# Patient Record
Sex: Male | Born: 1970 | Hispanic: No | Marital: Single | Smoking: Never smoker
Health system: Southern US, Community
[De-identification: ages and names within clinical notes are randomized; demographics above are authoritative.]

## PROBLEM LIST (undated history)

## (undated) DIAGNOSIS — K219 Gastro-esophageal reflux disease without esophagitis: Secondary | ICD-10-CM

## (undated) DIAGNOSIS — E119 Type 2 diabetes mellitus without complications: Secondary | ICD-10-CM

## (undated) DIAGNOSIS — K648 Other hemorrhoids: Principal | ICD-10-CM

## (undated) DIAGNOSIS — E785 Hyperlipidemia, unspecified: Secondary | ICD-10-CM

## (undated) DIAGNOSIS — E079 Disorder of thyroid, unspecified: Secondary | ICD-10-CM

## (undated) HISTORY — DX: Other hemorrhoids: K64.8

## (undated) HISTORY — DX: Disorder of thyroid, unspecified: E07.9

## (undated) HISTORY — DX: Hyperlipidemia, unspecified: E78.5

## (undated) HISTORY — DX: Type 2 diabetes mellitus without complications: E11.9

## (undated) HISTORY — DX: Gastro-esophageal reflux disease without esophagitis: K21.9

---

## 2001-05-30 ENCOUNTER — Inpatient Hospital Stay (HOSPITAL_COMMUNITY): Admission: EM | Admit: 2001-05-30 | Discharge: 2001-06-02 | Payer: Self-pay | Admitting: Emergency Medicine

## 2001-05-30 ENCOUNTER — Encounter: Payer: Self-pay | Admitting: Emergency Medicine

## 2001-05-30 ENCOUNTER — Encounter: Payer: Self-pay | Admitting: Family Medicine

## 2001-06-05 ENCOUNTER — Encounter: Admission: RE | Admit: 2001-06-05 | Discharge: 2001-06-05 | Payer: Self-pay | Admitting: Family Medicine

## 2001-06-18 ENCOUNTER — Encounter: Admission: RE | Admit: 2001-06-18 | Discharge: 2001-06-18 | Payer: Self-pay | Admitting: Family Medicine

## 2001-07-30 ENCOUNTER — Ambulatory Visit (HOSPITAL_COMMUNITY): Admission: RE | Admit: 2001-07-30 | Discharge: 2001-07-30 | Payer: Self-pay | Admitting: Family Medicine

## 2001-07-31 ENCOUNTER — Encounter: Payer: Self-pay | Admitting: Family Medicine

## 2004-01-16 ENCOUNTER — Encounter (HOSPITAL_COMMUNITY): Admission: RE | Admit: 2004-01-16 | Discharge: 2004-04-15 | Payer: Self-pay | Admitting: Endocrinology

## 2005-01-17 ENCOUNTER — Ambulatory Visit (HOSPITAL_COMMUNITY): Admission: RE | Admit: 2005-01-17 | Discharge: 2005-01-17 | Payer: Self-pay | Admitting: Gastroenterology

## 2011-05-15 ENCOUNTER — Emergency Department (HOSPITAL_COMMUNITY): Payer: BC Managed Care – PPO

## 2011-05-15 ENCOUNTER — Emergency Department (HOSPITAL_COMMUNITY)
Admission: EM | Admit: 2011-05-15 | Discharge: 2011-05-15 | Disposition: A | Discharge status: Home / Self Care | Payer: BC Managed Care – PPO | Attending: Emergency Medicine | Admitting: Emergency Medicine

## 2011-05-15 DIAGNOSIS — IMO0002 Reserved for concepts with insufficient information to code with codable children: Secondary | ICD-10-CM | POA: Insufficient documentation

## 2011-05-15 DIAGNOSIS — Z79899 Other long term (current) drug therapy: Secondary | ICD-10-CM | POA: Insufficient documentation

## 2011-05-15 DIAGNOSIS — E789 Disorder of lipoprotein metabolism, unspecified: Secondary | ICD-10-CM | POA: Insufficient documentation

## 2011-05-15 DIAGNOSIS — R070 Pain in throat: Secondary | ICD-10-CM | POA: Insufficient documentation

## 2011-05-15 DIAGNOSIS — T17208A Unspecified foreign body in pharynx causing other injury, initial encounter: Secondary | ICD-10-CM | POA: Insufficient documentation

## 2011-10-06 ENCOUNTER — Ambulatory Visit (INDEPENDENT_AMBULATORY_CARE_PROVIDER_SITE_OTHER): Payer: BC Managed Care – PPO

## 2011-10-06 DIAGNOSIS — F809 Developmental disorder of speech and language, unspecified: Secondary | ICD-10-CM

## 2011-10-06 DIAGNOSIS — E782 Mixed hyperlipidemia: Secondary | ICD-10-CM

## 2011-10-06 DIAGNOSIS — E039 Hypothyroidism, unspecified: Secondary | ICD-10-CM

## 2011-12-25 ENCOUNTER — Ambulatory Visit: Payer: BC Managed Care – PPO

## 2011-12-25 ENCOUNTER — Other Ambulatory Visit: Payer: Self-pay | Admitting: Internal Medicine

## 2011-12-25 ENCOUNTER — Ambulatory Visit (INDEPENDENT_AMBULATORY_CARE_PROVIDER_SITE_OTHER): Payer: BC Managed Care – PPO | Admitting: Family Medicine

## 2011-12-25 VITALS — BP 98/62 | HR 60 | Temp 98.3°F | Resp 16 | Ht 63.5 in | Wt 133.2 lb

## 2011-12-25 DIAGNOSIS — R739 Hyperglycemia, unspecified: Secondary | ICD-10-CM

## 2011-12-25 DIAGNOSIS — M79609 Pain in unspecified limb: Secondary | ICD-10-CM

## 2011-12-25 DIAGNOSIS — M79672 Pain in left foot: Secondary | ICD-10-CM

## 2011-12-25 DIAGNOSIS — E785 Hyperlipidemia, unspecified: Secondary | ICD-10-CM | POA: Insufficient documentation

## 2011-12-25 DIAGNOSIS — E039 Hypothyroidism, unspecified: Secondary | ICD-10-CM | POA: Insufficient documentation

## 2011-12-25 LAB — POCT CBC
HCT, POC: 40.9 % — AB (ref 43.5–53.7)
Hemoglobin: 13.5 g/dL — AB (ref 14.1–18.1)
Lymph, poc: 3.3 (ref 0.6–3.4)
MCHC: 33 g/dL (ref 31.8–35.4)
MPV: 10.2 fL (ref 0–99.8)
POC Granulocyte: 5.5 (ref 2–6.9)
RBC: 4.66 M/uL — AB (ref 4.69–6.13)

## 2011-12-25 LAB — POCT SEDIMENTATION RATE: POCT SED RATE: 19 mm/hr (ref 0–22)

## 2011-12-25 MED ORDER — PREDNISONE 20 MG PO TABS: ORAL_TABLET | ORAL | Status: DC

## 2011-12-25 NOTE — Progress Notes (Signed)
  Subjective:    Patient ID: Samuel Meza, male    DOB: Oct 13, 1970, 41 y.o.   MRN: [id number]  Toe Pain  The incident occurred 3 to 5 days ago. The incident occurred at home. There was no injury mechanism. The pain is present in the left toes. The quality of the pain is described as shooting. The pain is moderate. Associated symptoms include an inability to bear weight. He reports no foreign bodies present.  Samuel Meza is a 41 year old [place] who is here with his son, Hi, who speaks some [place].  He returned from [place] 2 days ago and complains of pain in the front of his left foot.  Very tender, unable to bear weight on front of foot.  He had an episode of pain and swelling in his left foot while in [place] but I was unable to determine how long ago that was in relation to today's symptoms.  His son said he had never had gout.  This is the first time his foot has ever hurt him and he denies injury.  He is treated by Dr. Neva Seat for hyperlipidemia and hypthyroidism.    Review of Systems  Constitutional: Negative.   HENT: Negative.   Respiratory: Negative.   Cardiovascular: Negative.   Musculoskeletal: Positive for joint swelling and gait problem.  Neurological: Negative.   Hematological: Negative.   Psychiatric/Behavioral: Negative.   All other systems reviewed and are negative.       Objective:   Physical Exam  Vitals reviewed. Constitutional: He appears well-developed and well-nourished.  HENT:  Head: Normocephalic.  Right Ear: External ear normal.  Left Ear: External ear normal.  Eyes: Conjunctivae are normal.  Cardiovascular: Normal rate, regular rhythm and normal heart sounds.   Pulmonary/Chest: Breath sounds normal.  Musculoskeletal:       Exquisite tenderness over his left mtp joints 2-4.  He has no swelling present on exam, his great toe is not as tender as the middle of his foot.  Left knee, achilles heel without pain or abnormality on exam  Neurological: He is alert.    Skin: Skin is warm and dry. No erythema.  Psychiatric: He has a normal mood and affect. His behavior is normal.     [facility] reading (PRIMARY) by  Dr. Milus Glazier.  No fractures, erosions, or bony abnormalities seen.       Assessment & Plan:  TEndinitis and overuse likely from walking in [place].  Discussed with Dr. Milus Glazier, will treat with 40 mg of prednisone for 5 days.  Return in 3 months for recheck on lipids and elevated glucose.

## 2011-12-25 NOTE — Patient Instructions (Signed)
Vim Gn (Tendinitis) Vim gn l tnh tr?ng s?ng t?y (vim) c?a m ???c g?i l gn. Gn c c?u trc gi?ng nh? s?i dy g?n c? vo x??ng. B?nh th??ng x?y ra ?:  Vai (c? chp xoay).   Gt chn (gn gt achilles).   Khu?u tay (gn c? tam ??u).  NGUYN NHN Vim gn th??ng gy ra b?i vi?c l?m d?ng gn, c? v kh?p lin quan. Khi m xung quanh gn (mng ho?t d?ch) b? vim, n ???c g?i l vim bao gn. Vim gn th??ng pht tri?n ? nh?ng ng??i c cng vi?c ?i h?i chuy?n ??ng l?p ?i l?p l?i. TRI?U CH?NG  ?au.   Nh?y c?m ?au.   S?ng nh?.  CH?N ?ON Vim gn th??ng ???c ch?n ?on b?ng cch khm tr?c ti?p. Chuyn gia ch?m [place] y t? c?a b?n c?ng c th? yu c?u ch?p [place] ho?c cc xt nghi?m hnh ?nh khc.  ?I?U TR? Chuyn gia ch?m [place] y t? c?a b?n c th? gi?i thi?u m?t s? lo?i thu?c ho?c cc bi t?p cho vi?c ?i?u tr? c?a b?n.  H??NG D?N CH?M [place] T?I NH  Dng b?ng ?eo hay n?p cho ??n khi gi?m ?au mi?n l theo s? ch? d?n c?a chuyn gia ch?m [place] y t? c?a b?n.   Ch??m ? l?nh ln vng b? th??ng.   Cho ? bo vo ti nh?a.   ??t kh?n t?m gi?a da v ti.   Ch??m ? l?nh trong 15 ??n 20 pht, 3 ??n 4 l?n m?i ngy.   [place] s? d?ng chn tay trong khi ?au gn. Ch? th?c hi?n hng lo?t bi t?p chuy?n ??ng nh? nhng theo ch? d?n c?a chuyn gia ch?m [place] y t?. Ng?ng t?p n?u ?au t?ng ho?c gia t?ng s? kh ch?u, tr? khi ???c chuyn gia ch?m [place] y t? ch? ??nh khc.   Ch? dng cc thu?c ???c bn khng c?n ??n thu?c c?a Bc s? ho?c theo toa c?a Bc s? ?? gi?m ?au, kh ch?u, hay s?t theo nh? h??ng d?n c?a Bc s?.  HY THAM V?N V?I CHUYN GIA Y T? N?U:  ?au hay s?ng nhi?u h?n.   Xu?t hi?n cc tri?u ch?ng m?i, khng gi?i thch ???c, ??c bi?t l t tay nhi?u h?n.  ??M B?O B?N:   Hi?u r nh?ng h??ng d?n khi xu?t vi?n.   S? theo di tnh tr?ng b?nh c?a b?n.   S? ??n khm b?nh ngay l?p t?c nh? ? ???c h??ng d?n.  Document Released: 09/23/2005 Document Revised: 09/12/2011 [facility] Patient Information  2012 [place], [place].

## 2011-12-26 LAB — BASIC METABOLIC PANEL
CO2: 29 mEq/L (ref 19–32)
Chloride: 104 mEq/L (ref 96–112)
Creat: 1.11 mg/dL (ref 0.50–1.35)
Potassium: 4.4 mEq/L (ref 3.5–5.3)
Sodium: 141 mEq/L (ref 135–145)

## 2012-01-06 ENCOUNTER — Ambulatory Visit (INDEPENDENT_AMBULATORY_CARE_PROVIDER_SITE_OTHER): Payer: BC Managed Care – PPO | Admitting: Family Medicine

## 2012-01-06 DIAGNOSIS — M109 Gout, unspecified: Secondary | ICD-10-CM

## 2012-01-06 DIAGNOSIS — J329 Chronic sinusitis, unspecified: Secondary | ICD-10-CM

## 2012-01-06 DIAGNOSIS — J019 Acute sinusitis, unspecified: Secondary | ICD-10-CM

## 2012-01-06 DIAGNOSIS — M79609 Pain in unspecified limb: Secondary | ICD-10-CM

## 2012-01-06 MED ORDER — AZITHROMYCIN 250 MG PO TABS: ORAL_TABLET | ORAL | Status: AC

## 2012-01-06 MED ORDER — INDOMETHACIN 50 MG PO CAPS: 50.0000 mg | ORAL_CAPSULE | Freq: Two times a day (BID) | ORAL | Status: AC

## 2012-01-06 NOTE — Progress Notes (Signed)
[facility]:  Office Visit  Chief Complaint:  Chief Complaint  Patient presents with  . Foot Pain    swollen for several weeks    HPI: Samuel Meza is a 41 y.o. male who complains of  2 month h/o left foot pain at dorsum of 2d-3rd MTP. Xrays done on 3/20 were negative for fx/dislocations. Was given Prednisone and felt  better but has had flare up again. This is the 4th flare up. His uric acid was 7.5 at that time. He also c/o sinus pressure, nonproductive cough and ear pressure for over 5 days. Denies fevers, chills, N/V/abd pain.   No past medical history on file. No past surgical history on file. History   Social History  . Marital Status: Single    Spouse Name: N/A    Number of Children: N/A  . Years of Education: N/A   Social History Main Topics  . Smoking status: Never Smoker   . Smokeless tobacco: None  . Alcohol Use: Yes     ocas  . Drug Use: No  . Sexually Active: None   Other Topics Concern  . None   Social History Narrative  . None   No family history on file. No Known Allergies Prior to Admission medications   Medication Sig Start Date End Date Taking? Authorizing Provider  levothyroxine (SYNTHROID, LEVOTHROID) 88 MCG tablet Take 88 mcg by mouth daily.   Yes Historical Provider, MD  simvastatin (ZOCOR) 20 MG tablet Take 20 mg by mouth every evening.   Yes Historical Provider, MD  predniSONE (DELTASONE) 20 MG tablet Take two daily for 5 days 12/25/11   Rickard Patience, PA-C     ROS: The patient denies fevers, chills, night sweats, unintentional weight loss, chest pain, palpitations, wheezing, dyspnea on exertion, nausea, vomiting, abdominal pain, dysuria, hematuria, melena, numbness, weakness, or tingling.   All other systems have been reviewed and were otherwise negative with the exception of those mentioned in the HPI and as above.    PHYSICAL EXAM: Filed Vitals:   01/06/12 0911  BP: 111/72  Pulse: 66  Temp: 98.1 F (36.7 C)  Resp:  16   Filed Vitals:   01/06/12 0911  Height: 5\' 4"  (1.626 m)  Weight: 128 lb (58.06 kg)   Body mass index is 21.97 kg/(m^2).  General: Alert, no acute distress HEENT:  Normocephalic, atraumatic, oropharynx patent. Tm nl, + sinus pressure, red throat, no exudates Cardiovascular:  Regular rate and rhythm, no rubs murmurs or gallops.  No Carotid bruits, radial pulse intact. No pedal edema.  Respiratory: Clear to auscultation bilaterally.  No wheezes, rales, or rhonchi.  No cyanosis, no use of accessory musculature GI: No organomegaly, abdomen is soft and non-tender, positive bowel sounds.  No masses. Skin: No rashes. Neurologic: Facial musculature symmetric. Psychiatric: Patient is appropriate throughout our interaction. Lymphatic: No cervical lymphadenopathy Musculoskeletal: Gait intact. Left Foot: AROM/PROM intact, sensation intact. + mild swelling and pain at dorsum of foor at 2nd and 3rd MTP   LABS: Results for orders placed in visit on 12/25/11  POCT CBC      Component Value Range   WBC 9.4  4.6 - 10.2 (K/uL)   Lymph, poc 3.3  0.6 - 3.4    POC LYMPH PERCENT 34.9  10 - 50 (%L)   MID (cbc) 0.6  0 - 0.9    POC MID % 6.9  0 - 12 (%M)   POC Granulocyte 5.5  2 - 6.9  Granulocyte percent 58.2  37 - 80 (%G)   RBC 4.66 (*) 4.69 - 6.13 (M/uL)   Hemoglobin 13.5 (*) 14.1 - 18.1 (g/dL)   HCT, POC 09.8 (*) 11.9 - 53.7 (%)   MCV 87.7  80 - 97 (fL)   MCH, POC 29.0  27 - 31.2 (pg)   MCHC 33.0  31.8 - 35.4 (g/dL)   RDW, POC 14.7     Platelet Count, POC 268  142 - 424 (K/uL)   MPV 10.2  0 - 99.8 (fL)  POCT SEDIMENTATION RATE      Component Value Range   POCT SED RATE 19  0 - 22 (mm/hr)  BASIC METABOLIC PANEL      Component Value Range   Sodium 141  135 - 145 (mEq/L)   Potassium 4.4  3.5 - 5.3 (mEq/L)   Chloride 104  96 - 112 (mEq/L)   CO2 29  19 - 32 (mEq/L)   Glucose, Bld 77  70 - 99 (mg/dL)   BUN 17  6 - 23 (mg/dL)   Creat 8.29  5.62 - 1.30 (mg/dL)   Calcium 9.3  8.4 - 86.5  (mg/dL)     EKG/XRAY:   Primary read interpreted by Dr. Conley Rolls at [facility].   ASSESSMENT/PLAN: Encounter Diagnoses  Name Primary?  . Gout Yes  . Sinusitis     Foot pain most likely related to gout. He has had no trauma. Prior xrays did not show any fractures. However, if pain continues or worsens then consider repeat xray-declines at this time. ? Stress fx vs unlikely [employer] 1. Indomethacin ( Consider maintenance allopurinol if gout flare up continues.) 2. Z-pack,  C/w OTC meds for cough    Devynne Sturdivant PHUONG, DO 01/06/2012 9:42 AM

## 2012-01-30 ENCOUNTER — Ambulatory Visit (INDEPENDENT_AMBULATORY_CARE_PROVIDER_SITE_OTHER): Payer: BC Managed Care – PPO | Admitting: Emergency Medicine

## 2012-01-30 ENCOUNTER — Ambulatory Visit: Payer: BC Managed Care – PPO

## 2012-01-30 VITALS — BP 103/68 | HR 63 | Temp 97.8°F | Resp 16 | Ht 64.0 in | Wt 127.4 lb

## 2012-01-30 DIAGNOSIS — M79672 Pain in left foot: Secondary | ICD-10-CM

## 2012-01-30 DIAGNOSIS — M79609 Pain in unspecified limb: Secondary | ICD-10-CM

## 2012-01-30 LAB — POCT CBC
Granulocyte percent: 68.6 %G (ref 37–80)
HCT, POC: 40 % — AB (ref 43.5–53.7)
Hemoglobin: 12.6 g/dL — AB (ref 14.1–18.1)
MPV: 9.9 fL (ref 0–99.8)
POC Granulocyte: 6.2 (ref 2–6.9)
POC LYMPH PERCENT: 25.7 %L (ref 10–50)
RDW, POC: 13.5 %

## 2012-01-30 MED ORDER — PREDNISONE 10 MG PO TABS: ORAL_TABLET | ORAL | Status: DC

## 2012-01-30 NOTE — Progress Notes (Signed)
  Subjective:    Patient ID: Samuel Meza, male    DOB: 07-21-1971, 41 y.o.   MRN: [id number]  HPI patient was seen here one month ago with pain and swelling over the distal left foot he has had persistent pain after being seen here. X-rays done at that time did not reveal any abnormalities of the bones. In today for recheck because he has persistent pain    Review of Systems     Objective:   Physical Exam physical exam reveals tenderness and swelling with ecchymoses present over the distal second and third metatarsals. There is pain with movement of the toes in this area. No open areas of skin  [facility] reading (PRIMARY) by  Dr.Bosten Newstrom on review of these films I do not see a definite fracture. .      Assessment & Plan:  Clinically the patient is a fracture of the distal second or third metatarsal. I will wait for the reading from the radiologist.

## 2012-01-30 NOTE — Patient Instructions (Signed)
B?nh Gt (Gout)  Gt l m?t tnh tr?ng vim nhi?m (vim kh?p) gy ra b?i s? tch t? c?a tinh th? axit uric trong kh?p. Axit uric l m?t ch?t ha h?c th??ng hi?n di?n trong mu. Trong m?t s? tr??ng h?p, axit uric c th? hnh thnh cc tinh th? trong kh?p x??ng c?a b?n. ?i?u ny gy ra t?y ??, ?au nh?c v s?ng kh?p (vim). Cc ??t ?au l?p l?i l ph? bi?n. Theo th?i gian, cc tinh th? axit uric c th? hnh thnh v?i quy m l?n (tophi) g?n kh?p, gy bi?n d?ng. Gt c th? ?i?u tr? v th??ng ng?n ng?a ???c. NGUYN NHN C?n b?nh ny b?t ??u v?i n?ng ?? axit uric trong mu t?ng. Axit uric ???c t?o ra b?i c? th? khi n ph v? m?t ch?t t? nhin tm th?y ???c g?i l purin. ?i?u ny c?ng x?y ra khi b?n ?n m?t s? lo?i th?c ph?m nh? th?t v c. Nguyn nhn c?a n?ng ?? axit uric cao bao g?m:  Truy?n t? cha m? sang con (di truy?n).   B?nh gy ra gia t?ng s?n xu?t axit uric (bo ph, b?nh v?y n?n, m?t s? b?nh ung th?).   S? d?ng r??u qu m?c.   Ch? ?? ?n, ??c bi?t l ch? ?? ?n nhi?u th?t v h?i s?n.   Thu?c, bao g?m c? m?t s? lo?i thu?c ch?ng ung th? (ha tr?), thu?c l?i ti?u v atpirin.   B?nh th?n mn tnh. Th?n khng cn c th? lo?i b? t?t axit uric.   V?n ?? v? chuy?n ha.  Cc tnh tr?ng g?n li?n v?i b?nh gt bao g?m:  Bo ph.   p huy?t cao.   Cholesterol cao.   Ti?u ???ng.  Khng ph?i t?t c? m?i ng??i v?i n?ng ?? axit uric cao ??u b? b?nh gt. Khng th? gi?i thch v sao m?t s? ng??i b? gt cn nh?ng ng??i khc l?i khng b?. Ph?u thu?t, ch?n th??ng kh?p v ?n qu nhi?u m?t s? lo?i th?c ?n nh?t ??nh l m?t s? trong nh?ng y?u t? c th? d?n ??n b?nh gt.  TRI?U CH?NG  C?n b?nh gt xu?t hi?n nhanh chng. B?nh gy ?au ??n d? d?i v?i t?y ??, s?ng v ?m trong kh?p.   C th? b? s?t.   Thng th??ng, ch? l m?t kh?p b? ?au. M?t s? kh?p nh?t ??nh th??ng b? ?au:   ?? ngn chn ci.   ??u g?i.   M?t c chn.   C? tay.   Ngn tay.  N?u khng ?i?u tr?, c?n ?au th??ng bi?n m?t trong m?t vi ngy  ??n vi tu?n. Gi?a cc c?n ?au, b?n th??ng s? khng c cc tri?u ch?ng, cc tri?u ch?ng th??ng khc v?i cc hnh th?c vim kh?p khc. CH?N ?ON Chuyn gia ch?m sc y t? s? nghi ng? b?nh gt d?a trn cc tri?u ch?ng v vi?c khm. Vi?c ht d?ch t? kh?p (ch?c kh?p) ???c th?c hi?n ?? ki?m tra cc tinh th? axit uric. Chuyn gia ch?m sc y t? s? cung c?p cho b?n m?t lo?i thu?c lm t khu v?c (gy t c?c b?) v s? d?ng m?t kim tim ?? ht d?ch kh?p ?? xt nghi?m. Gt ???c xc nh?n khi cc tinh th? axit uric ???c tm th?y trong d?ch kh?p, b?ng cch s? d?ng m?t knh hi?n vi ??c bi?t. ?i khi, cc xt nghi?m mu, n??c ti?u, X quang c?ng ???c s? d?ng. ?I?U TR? C 2 giai ?o?n ?? ?i?u tr? b?nh gt: ?i?u   tr? c?n ?au kh?i pht ??t ng?t (c?p tnh) v ng?n ch?n cc c?n ?au (d? phng). ?i?u Tr? C?n ?au C?p Tnh  Thu?c ???c s? d?ng. Chng bao g?m cc lo?i thu?c ch?ng vim ho?c thu?c steroid.   ?i khi c?n tim thu?c steroid vo kh?p b? ?au.   Kh?p ?au ???c ngh? ng?i. S? v?n ??ng c th? lm tr?m tr?ng thm b?nh vim kh?p.   B?n c th? s? d?ng ph??ng php ?i?u tr? ?m ho?c l?nh trn kh?p b? ?au, ty thu?c ph??ng php no ph h?p nh?t v?i b?n.   Th?o lu?n v? vi?c s? d?ng c ph, vitamin C, ho?c anh ?o v?i chuyn gia ch?m sc y t?. ?y c th? l cc l?a ch?n ?i?u tr? h?u ch.  ?i?u Tr? ?? Ng?n Ch?n Cc C?n ?au Sau khi c?n ?au c?p tnh gi?m, chuyn gia ch?m sc y t? c th? t? v?n s? d?ng thu?c d? phng. Nh?ng lo?i thu?c ny c gip th?n lo?i b? axit uric ra kh?i c? th? c?a b?n ho?c gi?m s?n xu?t axit uric. B?n c th? c?n ti?p t?c s? d?ng cc lo?i thu?c ny trong m?t th?i gian r?t di. Giai ?o?n ?i?u tr? ban ??u b?ng thu?c d? phng c th? k?t h?p v?i s? gia t?ng cc c?n ?au gt c?p. V l do ny, trong nh?ng thng ??u ?i?u tr?, chuyn gia ch?m sc y t? c?ng c th? t? v?n cho b?n dng thu?c th??ng ???c s? d?ng ?? ?i?u tr? b?nh gt c?p tnh. ??m b?o b?n hi?u h??ng d?n c?a chuyn gia ch?m sc y t?. B?n c?ng nn th?o lu?n v?  ph??ng php ?i?u tr? b?ng ch? ?? ?n u?ng v?i chuyn gia ch?m sc y t?. M?t s? th?c ph?m nh?t ??nh nh? th?t v c c th? lm t?ng n?ng ?? axit uric. Cc lo?i th?c ph?m khc nh? s?a c th? lm gi?m n?ng ?? ny. Chuyn gia ch?m sc y t? c th? cung c?p cho b?n m?t danh sch nh?ng th?c ph?m c?n trnh. H??NG D?N CH?M SC T?I NH  KHNG u?ng aspirin ?? gi?m ?au; thu?c ny lm t?ng m?c ?? a-xt uric.   Ch? dng cc thu?c ???c bn khng c?n ??n thu?c c?a Bc s? ho?c theo toa c?a Bc s? ?? gi?m ?au, kh ch?u, hay s?t theo nh? h??ng d?n c?a Bc s?.   ?? kh?p ngh? ng?i cng nhi?u cng t?t. Khi ? trn gi??ng, gi? cho kh?n tr?i gi??ng v ch?n trnh xa nh?ng ch? b? ?au.   Gi? cho kh?p b? ?au ? trn cao (gi? cao).   Dng n?ng n?u ch? kh?p b? ?au l ? chn.   U?ng ?? n??c v dung d?ch ?? n??c ti?u trong ho?c c mu vng nh?t . Lm nh? v?y gip c? th? c?a b?n lo?i b? ???c a-xt uric. KHNG u?ng cc ?? u?ng c ch?t c?n v nh?ng ?? u?ng ny lm ch?m qu trnh bi ti?t a-xt uric.   Th?c hi?n theo h??ng d?n v? ch? ?? ?n u?ng c?a chuyn gia ch?m sc y t?. Hy ch  c?n th?n ??n l??ng prtein b?n ?n. Ch? ?? ?n u?ng hng ngy c?a b?n nn t?p trung vo tri cy, rau, ng? c?c nguyn h?t v nh?ng s?n ph?m s?a khng c ch?t bo ho?c t ch?t bo.   Duy tr tr?ng l??ng c? th? kh?e m?nh.  H?Y THAM V?N V?I CHUYN GIA Y T? N?U:  Nhi?t ?? ?o ? mi?ng trn 102 F (38.9   C).   B?n b? tiu ch?y ho?c nn m?a.   B?n khng th?y ?? trong vng 24 ti?ng ??ng h? ho?c ngy cng n?ng h?n.  H?Y NGAY L?P T?C THAM V?N V?I CHUYN GIA Y T? N?U:  Kh?p c?a b?n b?ng d?ng tr? nn m?m h?n nhi?u v ?i km v?i:   Nh?ng c?n rng mnh v l?nh.   Nhi?t ?? ?o ? mi?ng trn 102 F (38.9 C), khng gi?m sau khi dng thu?c.  ??M B?O B?N:  Hi?u r nh?ng h??ng d?n khi xu?t vi?n.   S? theo di tnh tr?ng b?nh c?a b?n.   S? ??n khm b?nh ngay l?p t?c nh? ? ???c h??ng d?n.  Document Released: 07/03/2005 Document Revised: 09/12/2011 [employer]  Patient Information 2012 [employer]. 

## 2012-01-31 LAB — URIC ACID: Uric Acid, Serum: 9.3 mg/dL — ABNORMAL HIGH (ref 4.0–7.8)

## 2012-02-04 ENCOUNTER — Encounter: Payer: Self-pay | Admitting: Family Medicine

## 2012-02-04 ENCOUNTER — Ambulatory Visit (INDEPENDENT_AMBULATORY_CARE_PROVIDER_SITE_OTHER): Payer: BC Managed Care – PPO | Admitting: Family Medicine

## 2012-02-04 DIAGNOSIS — M109 Gout, unspecified: Secondary | ICD-10-CM | POA: Insufficient documentation

## 2012-02-04 MED ORDER — ALLOPURINOL 300 MG PO TABS: 300.0000 mg | ORAL_TABLET | Freq: Every day | ORAL | Status: DC

## 2012-02-04 NOTE — Progress Notes (Signed)
  Subjective:    Patient ID: Samuel Meza, male    DOB: 12-07-1970, 41 y.o.   MRN: [id number]  HPI 41 yo male with gout here to discuss gout prophylaxis.  Multiple flares recently and uric acid last week was over 9. Treated with prednisone at last flare.  Pain is resolved now.     Review of Systems Negative except as per HPI     Objective:   Physical Exam  Constitutional: He appears well-developed.  Pulmonary/Chest: Effort normal.  Neurological: He is alert.          Assessment & Plan:  Gout.  Start allopurinol 300 daily.  Recheck uric acid level in 1-2 weeks.

## 2012-02-25 ENCOUNTER — Ambulatory Visit (INDEPENDENT_AMBULATORY_CARE_PROVIDER_SITE_OTHER): Payer: BC Managed Care – PPO | Admitting: Physician Assistant

## 2012-02-25 DIAGNOSIS — R5383 Other fatigue: Secondary | ICD-10-CM

## 2012-02-25 DIAGNOSIS — M109 Gout, unspecified: Secondary | ICD-10-CM

## 2012-02-25 DIAGNOSIS — R5381 Other malaise: Secondary | ICD-10-CM

## 2012-02-25 NOTE — Progress Notes (Signed)
Patient ID: Samuel Meza MRN: [id number], DOB: [birth year], 41 y.o. Date of Encounter: 02/25/2012, 12:07 PM  Primary Physician: No primary provider on file.  Chief Complaint: Gout follow up  HPI: 41 y.o. year old male with history below presents for follow up of gout. See previous office visits. Patient started on Allopurinol 300mg  on 02/04/12. Doing well with treatment. No acute flares. No pain. Last uric acid 9.3 on 01/30/12. He also requests to check his thyroid. History of hypothyroidism. On Synthroid 88 mcg. Has been a little tired lately. Would like to see if his Synthroid needs to be increased.     Past Medical History  Diagnosis Date  . Hyperlipidemia   . Thyroid disease   . Gout      Home Meds: Prior to Admission medications   Medication Sig Start Date End Date Taking? Authorizing Provider  allopurinol (ZYLOPRIM) 300 MG tablet Take 1 tablet (300 mg total) by mouth daily. 02/04/12 02/03/13 Yes Lamar Laundry, MD  levothyroxine (SYNTHROID, LEVOTHROID) 88 MCG tablet Take 88 mcg by mouth daily.   Yes Historical Provider, MD  simvastatin (ZOCOR) 20 MG tablet Take 20 mg by mouth every evening.   Yes Historical Provider, MD  indomethacin (INDOCIN) 50 MG capsule Take 1 capsule (50 mg total) by mouth 2 (two) times daily with a meal. 01/06/12 04/05/12 No Thao P Le, DO  predniSONE (DELTASONE) 10 MG tablet Take 5 tablets a day at one time for 5 consecutive days 01/30/12  No Collene Gobble, MD    Allergies: No Known Allergies  History   Social History  . Marital Status: Single    Spouse Name: N/A    Number of Children: N/A  . Years of Education: N/A   Occupational History  . Not on file.   Social History Main Topics  . Smoking status: Never Smoker   . Smokeless tobacco: Not on file  . Alcohol Use: Yes     ocas  . Drug Use: No  . Sexually Active: Not on file   Other Topics Concern  . Not on file   Social History Narrative  . No narrative on file     Review of  Systems: Constitutional: negative for chills, fever, night sweats, weight changes, or fatigue  Cardiovascular: negative for chest pain or palpitations Respiratory: negative for hemoptysis, wheezing, shortness of breath, or cough Abdominal: negative for abdominal pain, nausea, or vomiting Dermatological: see above Neurologic: negative for headache, dizziness, or syncope   Physical Exam: Blood pressure 94/59, pulse 56, temperature 98.4 F (36.9 C), temperature source Oral, resp. rate 16, height 5\' 4"  (1.626 m), weight 125 lb (56.7 kg)., Body mass index is 21.46 kg/(m^2). General: Well developed, well nourished, in no acute distress. Head: Normocephalic, atraumatic, eyes without discharge, sclera non-icteric, nares are without discharge. Bilateral auditory canals clear, TM's are without perforation, pearly grey and translucent with reflective cone of light bilaterally. Oral cavity moist, posterior pharynx without exudate, erythema, peritonsillar abscess, or post nasal drip.  Neck: Supple. No thyromegaly. Full ROM. No lymphadenopathy. Lungs: Clear bilaterally to auscultation without wheezes, rales, or rhonchi. Breathing is unlabored. Heart: RRR with S1 S2. No murmurs, rubs, or gallops appreciated. Msk:  Strength and tone normal for age. No TTP over left distal foot. Extremities/Skin: Warm and dry. No clubbing or cyanosis. No edema. No rashes or suspicious lesions. Neuro: Alert and oriented X 3. Moves all extremities spontaneously. Gait is normal. CNII-XII grossly in tact. Psych:  Responds to questions appropriately  with a normal affect.   Labs: Uric acid and BMP pending TSH pending  ASSESSMENT AND PLAN:  41 y.o. year old male with gout, hyperuricemia, and hypothyroidism. 1. Hyperuricemia -Titrate Allopurinol once labs are back -Await labs -Continue to titrate ULT until goal uric acid less than 6 2. Hypothyroidism -Await labs -Refill meds pending labs  Signed, Eula Listen,  PA-C 02/25/2012 12:07 PM

## 2012-02-26 ENCOUNTER — Other Ambulatory Visit: Payer: Self-pay | Admitting: Physician Assistant

## 2012-02-26 LAB — URIC ACID: Uric Acid, Serum: 4.2 mg/dL (ref 4.0–7.8)

## 2012-02-26 LAB — TSH: TSH: 0.502 u[IU]/mL (ref 0.350–4.500)

## 2012-02-26 LAB — BASIC METABOLIC PANEL
Chloride: 102 mEq/L (ref 96–112)
Creat: 0.87 mg/dL (ref 0.50–1.35)

## 2012-02-26 MED ORDER — ALLOPURINOL 300 MG PO TABS: ORAL_TABLET | ORAL | Status: DC

## 2012-04-12 ENCOUNTER — Ambulatory Visit (INDEPENDENT_AMBULATORY_CARE_PROVIDER_SITE_OTHER): Payer: BC Managed Care – PPO | Admitting: Emergency Medicine

## 2012-04-12 VITALS — BP 108/72 | HR 88 | Temp 97.8°F | Resp 16 | Ht 63.75 in | Wt 125.4 lb

## 2012-04-12 DIAGNOSIS — E785 Hyperlipidemia, unspecified: Secondary | ICD-10-CM

## 2012-04-12 DIAGNOSIS — E039 Hypothyroidism, unspecified: Secondary | ICD-10-CM

## 2012-04-12 DIAGNOSIS — M109 Gout, unspecified: Secondary | ICD-10-CM

## 2012-04-12 LAB — POCT CBC
Granulocyte percent: 74.6 %G (ref 37–80)
HCT, POC: 46.6 % (ref 43.5–53.7)
Hemoglobin: 14.3 g/dL (ref 14.1–18.1)
MPV: 10 fL (ref 0–99.8)
POC Granulocyte: 8.2 — AB (ref 2–6.9)
POC MID %: 5.4 %M (ref 0–12)
RBC: 5.14 M/uL (ref 4.69–6.13)

## 2012-04-12 LAB — LIPID PANEL
Cholesterol: 146 mg/dL (ref 0–200)
Total CHOL/HDL Ratio: 3 Ratio
Triglycerides: 147 mg/dL (ref <150)
VLDL: 29 mg/dL (ref 0–40)

## 2012-04-12 LAB — URIC ACID: Uric Acid, Serum: 4.8 mg/dL (ref 4.0–7.8)

## 2012-04-12 MED ORDER — SIMVASTATIN 20 MG PO TABS: 20.0000 mg | ORAL_TABLET | Freq: Every evening | ORAL | Status: DC

## 2012-04-12 MED ORDER — LEVOTHYROXINE SODIUM 88 MCG PO TABS: 88.0000 ug | ORAL_TABLET | Freq: Every day | ORAL | Status: DC

## 2012-04-12 MED ORDER — ALLOPURINOL 300 MG PO TABS: ORAL_TABLET | ORAL | Status: DC

## 2012-04-12 NOTE — Progress Notes (Signed)
  Subjective:    Patient ID: Samuel Meza, male    DOB: 1970/11/25, 40 y.o.   MRN: [id number]  HPI patient here requesting refills on his medications. He is currently on thyroid medications cholesterol and gout drugs. He is doing well on his current medications.   Review of Systems     Objective:   Physical Exam  HENT:  Right Ear: External ear normal.  Left Ear: External ear normal.  Eyes: Pupils are equal, round, and reactive to light.  Neck: Normal range of motion. No thyromegaly present.  Cardiovascular: Normal rate and regular rhythm.   Pulmonary/Chest: Effort normal. No respiratory distress. He has no wheezes. He has no rales.  Abdominal: Soft.   Results for orders placed in visit on 04/12/12  POCT CBC      Component Value Range   WBC 11.0 (*) 4.6 - 10.2 K/uL   Lymph, poc 2.2  0.6 - 3.4   POC LYMPH PERCENT 20.0  10 - 50 %L   MID (cbc) 0.6  0 - 0.9   POC MID % 5.4  0 - 12 %M   POC Granulocyte 8.2 (*) 2 - 6.9   Granulocyte percent 74.6  37 - 80 %G   RBC 5.14  4.69 - 6.13 M/uL   Hemoglobin 14.3  14.1 - 18.1 g/dL   HCT, POC 62.1  30.8 - 53.7 %   MCV 90.7  80 - 97 fL   MCH, POC 27.8  27 - 31.2 pg   MCHC 30.7 (*) 31.8 - 35.4 g/dL   RDW, POC 65.7     Platelet Count, POC 276  142 - 424 K/uL   MPV 10.0  0 - 99.8 fL         Assessment & Plan:     Patient doing well on current medications. No change in medications at present.

## 2012-06-25 ENCOUNTER — Ambulatory Visit: Payer: BC Managed Care – PPO | Admitting: Internal Medicine

## 2012-06-28 ENCOUNTER — Ambulatory Visit (INDEPENDENT_AMBULATORY_CARE_PROVIDER_SITE_OTHER): Payer: BC Managed Care – PPO | Admitting: Family Medicine

## 2012-06-28 VITALS — BP 128/70 | HR 72 | Temp 97.3°F | Resp 18 | Ht 63.75 in | Wt 125.0 lb

## 2012-06-28 DIAGNOSIS — J029 Acute pharyngitis, unspecified: Secondary | ICD-10-CM

## 2012-06-28 MED ORDER — PENICILLIN V POTASSIUM 500 MG PO TABS: 500.0000 mg | ORAL_TABLET | Freq: Three times a day (TID) | ORAL | Status: DC

## 2012-06-28 MED ORDER — IBUPROFEN 600 MG PO TABS: 600.0000 mg | ORAL_TABLET | Freq: Three times a day (TID) | ORAL | Status: DC | PRN

## 2012-06-28 NOTE — Progress Notes (Signed)
@[email or web address] @   Patient ID: Samuel Meza MRN: [id number], DOB: 01-01-1971, 41 y.o. Date of Encounter: 06/28/2012, 2:52 PM  Primary Physician: No primary provider on file.  Chief Complaint:  Chief Complaint  Patient presents with  . Sore Throat    2-3 days  . Fatigue    2-3 days     HPI: 41 y.o. year old male presents with day history of sore throat. Subjective fever and chills. No cough, congestion, rhinorrhea, sinus pressure, otalgia, or headache. Normal hearing. No GI complaints. Able to swallow saliva, but hurts to do so. Decreased appetite secondary to sore throat.   Past Medical History  Diagnosis Date  . Hyperlipidemia   . Thyroid disease   . Gout      Home Meds: Prior to Admission medications   Medication Sig Start Date End Date Taking? Authorizing Provider  levothyroxine (SYNTHROID, LEVOTHROID) 88 MCG tablet Take 1 tablet (88 mcg total) by mouth daily. 04/12/12  Yes Collene Gobble, MD  simvastatin (ZOCOR) 20 MG tablet Take 1 tablet (20 mg total) by mouth every evening. 04/12/12  Yes Collene Gobble, MD  allopurinol (ZYLOPRIM) 300 MG tablet As directed 04/12/12   Collene Gobble, MD  ibuprofen (ADVIL,MOTRIN) 600 MG tablet Take 1 tablet (600 mg total) by mouth every 8 (eight) hours as needed for pain. 06/28/12   Elvina Sidle, MD  penicillin v potassium (VEETID) 500 MG tablet Take 1 tablet (500 mg total) by mouth 3 (three) times daily. 06/28/12   Elvina Sidle, MD  predniSONE (DELTASONE) 10 MG tablet Take 5 tablets a day at one time for 5 consecutive days 01/30/12   Collene Gobble, MD    Allergies: No Known Allergies  History   Social History  . Marital Status: Single    Spouse Name: N/A    Number of Children: N/A  . Years of Education: N/A   Occupational History  . Not on file.   Social History Main Topics  . Smoking status: Never Smoker   . Smokeless tobacco: Not on file  . Alcohol Use: Yes     ocas  . Drug Use: No  . Sexually Active: Not on file   Other Topics  Concern  . Not on file   Social History Narrative  . No narrative on file     Review of Systems: Constitutional: negative for weight changes positive for  chills, fever, night sweats  HEENT: see above Cardiovascular: negative for chest pain or palpitations Respiratory: negative for hemoptysis, wheezing, or shortness of breath Abdominal: negative for abdominal pain, nausea, vomiting or diarrhea Dermatological: negative for rash Neurologic: negative for headache   Physical Exam Blood pressure 128/70, pulse 72, temperature 97.3 F (36.3 C), temperature source Oral, resp. rate 18, height 5' 3.75" (1.619 m), weight 125 lb (56.7 kg), SpO2 98.00%., Body mass index is 21.62 kg/(m^2). General: Well developed, well nourished, in no acute distress. Head: Normocephalic, atraumatic, eyes without discharge, sclera non-icteric, nares are patent. Bilateral auditory canals clear, TM's are without perforation, pearly grey with reflective cone of light bilaterally. No sinus TTP. Oral cavity moist, dentition normal. Posterior pharynx with post nasal drip and mild erythema. No peritonsillar abscess or tonsillar exudate. Neck: Supple. No thyromegaly. Full ROM. No lymphadenopathy. Lungs: Clear bilaterally to auscultation without wheezes, rales, or rhonchi. Breathing is unlabored. Heart: RRR with S1 S2. No murmurs, rubs, or gallops appreciated. Abdomen: Soft, non-tender, non-distended with normoactive bowel sounds. No hepatomegaly. No rebound/guarding. No obvious abdominal masses. Msk:  Strength and tone normal for age. Extremities: No clubbing or cyanosis. No edema. Neuro: Alert and oriented X 3. Moves all extremities spontaneously. CNII-XII grossly in tact. Psych:  Responds to questions appropriately with a normal affect.     ASSESSMENT AND PLAN:  41 y.o. year old male with  - -Tylenol/Motrin prn -Rest/fluids -RTC precautions -RTC 3-5 days if no improvement  Signed, Elvina Sidle,  MD 06/28/2012 2:52 PM

## 2012-06-28 NOTE — Patient Instructions (Signed)
Vim H?ng (do Vi-rt v Vi khu?n) (Pharyngitis, Viral and Bacterial) Vim h?ng l hi?n t??ng vim t?y ho?c nhi?m trng h?ng, ho?c ?au h?ng. NGUYN NHN ?a s? cc tr??ng h?p vim h?ng l do vi-rt v b? vim khi c?m l?nh. Tuy nhin, m?t s? tr??ng h?p vim h?ng l do vi khu?n (vi trng) lin c?u v cc vi khu?n khc gy ra. ?au h?ng c?ng c th? l do ch?y n??c pha sau m?i do d?n l?u xoang, d? ?ng, v ?i khi cn do h mi?ng khi ng?. ?au h?ng do nhi?m trng c th? ly t? ng??i ny sang ng??i khc qua ho, h?t h?i v dng chung ly ho?c bt ??a ?n u?ng. ?I?U TR? ?au h?ng do vi-rt th??ng ko di 3-4 ngy. B?nh do vi-rt s? ?? m khng c?n thu?c khng sinh. ?au h?ng do khu?n lin c?u v cc hi?n t??ng nhi?m trng do vi khu?n khc th??ng b?t ??u ?? kho?ng 24 ??n 48 gi? sau khi b?n b?t ??u u?ng khng sinh. H??NG D?N CH?M [place] T?I NH  N?u bc s? th?y b?n b? nhi?m trng do vi khu?n ho?c n?u xt nghi?m khu?n lin c?u c?a b?n l d??ng tnh, bc s? s? cho b?n ?i?u tr? b?ng khng sinh. B?n ph?i u?ng h?t ??t khng sinh!! N?u b?n khng u?ng h?t ??t khng sinh, b?n c th? s? ?m tr? l?i. N?u b?n b? ?au h?ng do khu?n lin c?u v khng u?ng h?t ton b? ??t thu?c, b?n c th? m?c b?nh tim ho?c th?n nguy hi?m.   U?ng th?t nhi?u n??c. Kho?ng 8-10 ly n??c m?i ngy. (V d? nh? n??c, n??c p hoa qu?, n??c qu?, Kool-aid, Gatorade, x-?a v.v...).   Ch? dng cc thu?c ???c bn khng c?n ??n thu?c c?a Bc s? ho?c theo toa c?a Bc s? ?? gi?m ?au, kh ch?u, hay s?t theo nh? h??ng d?n c?a Bc s?.   Ngh? ng?i nhi?u.   [place] h?ng b?ng n??c mu?i (1/2 tha c ph mu?i trong m?t ly n??c) t? 1-2 gi? m?t l?n khi c?n ?? th?y d? ch?u.   Ng?m k?o c?ng, ho?c vin thu?c ng?m/thu?c x?t ch?a ?au h?ng.  HY ??N KHM B?NH NGAY L?P T?C N?U:  B?n n?i h?ch to v m?m trn c?.   B?n ln m? ?ay.   B?n ho ra ??m mu xanh, nu vng ho?c c mu.   Tr? h?n 3 thng tu?i c nhi?t ?? ?o ? h?u mn l 100,5 F (38,1 C) ho?c cao h?n trong h?n 1 ngy.    HY ??N KHM B?NH NGAY L?P T?C N?U:  B?n b? c?ng c?.   B?n b? ch?y n??c m?i ho?c khng th? nu?t n??c ???c.   B?n b? nn m?a, khng th? gi? khng nn ra thu?c ho?c n??c.   B?n b? ?au d? d?i, u?ng cc lo?i thu?c ? ch? d?n khng c tc d?ng.   B?n kh th? (khng ph?i l do ng?t m?i).   B?n hay con c?a b?n khng th? h to mi?ng.   Xu?t hi?n ??, s?ng ph, hay ?au nhi?u b?t k? n?i no ? c?.   B?n b? s?t.   Tr? h?n 3 thng tu?i c nhi?t ?? ?o ? h?u mn l 102 F (38,9 C) ho?c cao h?n.   Tr? 3 thng tu?i ho?c nh? h?n c nhi?t ?? ?o ? h?u mn l 100,4 F (38 C) ho?c cao h?n.  HY CH?C CH?N R?NG B?N:  Hi?u r nh?ng h??ng d?n khi xu?t vi?n.  S? theo di tnh tr?ng b?nh c?a b?n.   S? ??n khm b?nh ngay l?p t?c nh? ? ???c h??ng d?n.  Document Released: 09/23/2005 Document Revised: 09/12/2011 [facility] Patient Information 2012 [place], [place].

## 2012-06-28 NOTE — Addendum Note (Signed)
Addended by: Elvina Sidle on: 06/28/2012 02:55 PM   Modules accepted: Orders

## 2012-06-30 LAB — CULTURE, GROUP A STREP: Organism ID, Bacteria: NORMAL

## 2012-08-03 ENCOUNTER — Encounter: Payer: Self-pay | Admitting: Internal Medicine

## 2012-08-03 ENCOUNTER — Other Ambulatory Visit (INDEPENDENT_AMBULATORY_CARE_PROVIDER_SITE_OTHER): Payer: BC Managed Care – PPO

## 2012-08-03 ENCOUNTER — Ambulatory Visit (INDEPENDENT_AMBULATORY_CARE_PROVIDER_SITE_OTHER): Payer: BC Managed Care – PPO | Admitting: Internal Medicine

## 2012-08-03 VITALS — BP 112/68 | HR 68 | Temp 97.3°F | Resp 16 | Ht 63.75 in | Wt 125.0 lb

## 2012-08-03 DIAGNOSIS — E039 Hypothyroidism, unspecified: Secondary | ICD-10-CM

## 2012-08-03 DIAGNOSIS — M109 Gout, unspecified: Secondary | ICD-10-CM

## 2012-08-03 DIAGNOSIS — E785 Hyperlipidemia, unspecified: Secondary | ICD-10-CM

## 2012-08-03 DIAGNOSIS — Z136 Encounter for screening for cardiovascular disorders: Secondary | ICD-10-CM | POA: Insufficient documentation

## 2012-08-03 DIAGNOSIS — Z0000 Encounter for general adult medical examination without abnormal findings: Secondary | ICD-10-CM

## 2012-08-03 DIAGNOSIS — R7309 Other abnormal glucose: Secondary | ICD-10-CM

## 2012-08-03 DIAGNOSIS — M791 Myalgia, unspecified site: Secondary | ICD-10-CM | POA: Insufficient documentation

## 2012-08-03 DIAGNOSIS — IMO0001 Reserved for inherently not codable concepts without codable children: Secondary | ICD-10-CM

## 2012-08-03 DIAGNOSIS — R739 Hyperglycemia, unspecified: Secondary | ICD-10-CM | POA: Insufficient documentation

## 2012-08-03 LAB — COMPREHENSIVE METABOLIC PANEL
ALT: 34 U/L (ref 0–53)
Alkaline Phosphatase: 36 U/L — ABNORMAL LOW (ref 39–117)
Sodium: 139 mEq/L (ref 135–145)
Total Bilirubin: 0.9 mg/dL (ref 0.3–1.2)
Total Protein: 7.5 g/dL (ref 6.0–8.3)

## 2012-08-03 LAB — LIPID PANEL
LDL Cholesterol: 76 mg/dL (ref 0–99)
VLDL: 30.8 mg/dL (ref 0.0–40.0)

## 2012-08-03 LAB — CBC WITH DIFFERENTIAL/PLATELET
Eosinophils Relative: 1.6 % (ref 0.0–5.0)
HCT: 45.1 % (ref 39.0–52.0)
Lymphs Abs: 1.9 10*3/uL (ref 0.7–4.0)
MCV: 87.5 fl (ref 78.0–100.0)
Monocytes Absolute: 0.5 10*3/uL (ref 0.1–1.0)
Platelets: 287 10*3/uL (ref 150.0–400.0)
RDW: 13.6 % (ref 11.5–14.6)
WBC: 7 10*3/uL (ref 4.5–10.5)

## 2012-08-03 LAB — URINALYSIS, ROUTINE W REFLEX MICROSCOPIC
Bilirubin Urine: NEGATIVE
Hgb urine dipstick: NEGATIVE
Nitrite: NEGATIVE
Total Protein, Urine: NEGATIVE

## 2012-08-03 LAB — SEDIMENTATION RATE: Sed Rate: 17 mm/hr (ref 0–22)

## 2012-08-03 LAB — T4: T4, Total: 10.2 ug/dL (ref 5.0–12.5)

## 2012-08-03 LAB — CK: Total CK: 160 U/L (ref 7–232)

## 2012-08-03 LAB — T3, FREE: T3, Free: 3.1 pg/mL (ref 2.3–4.2)

## 2012-08-03 LAB — PSA: PSA: 1.37 ng/mL (ref 0.10–4.00)

## 2012-08-03 NOTE — Assessment & Plan Note (Signed)
He is having myalgias and arthralgias so I have asked him to stop simvastatin, I will check his CPK and ESR today to see if he has a myopathy or myositis

## 2012-08-03 NOTE — Patient Instructions (Signed)
Health Maintenance, Males A healthy lifestyle and preventative care can promote health and wellness.  Maintain regular health, dental, and eye exams.  Eat a healthy diet. Foods like vegetables, fruits, whole grains, low-fat dairy products, and lean protein foods contain the nutrients you need without too many calories. Decrease your intake of foods high in solid fats, added sugars, and salt. Get information about a proper diet from your caregiver, if necessary.  Regular physical exercise is one of the most important things you can do for your health. Most adults should get at least 150 minutes of moderate-intensity exercise (any activity that increases your heart rate and causes you to sweat) each week. In addition, most adults need muscle-strengthening exercises on 2 or more days a week.   Maintain a healthy weight. The body mass index (BMI) is a screening tool to identify possible weight problems. It provides an estimate of body fat based on height and weight. Your caregiver can help determine your BMI, and can help you achieve or maintain a healthy weight. For adults 20 years and older:  A BMI below 18.5 is considered underweight.  A BMI of 18.5 to 24.9 is normal.  A BMI of 25 to 29.9 is considered overweight.  A BMI of 30 and above is considered obese.  Maintain normal blood lipids and cholesterol by exercising and minimizing your intake of saturated fat. Eat a balanced diet with plenty of fruits and vegetables. Blood tests for lipids and cholesterol should begin at age 20 and be repeated every 5 years. If your lipid or cholesterol levels are high, you are over 50, or you are a high risk for heart disease, you may need your cholesterol levels checked more frequently.Ongoing high lipid and cholesterol levels should be treated with medicines, if diet and exercise are not effective.  If you smoke, find out from your caregiver how to quit. If you do not use tobacco, do not start.  If you  choose to drink alcohol, do not exceed 2 drinks per day. One drink is considered to be 12 ounces (355 mL) of beer, 5 ounces (148 mL) of wine, or 1.5 ounces (44 mL) of liquor.  Avoid use of street drugs. Do not share needles with anyone. Ask for help if you need support or instructions about stopping the use of drugs.  High blood pressure causes heart disease and increases the risk of stroke. Blood pressure should be checked at least every 1 to 2 years. Ongoing high blood pressure should be treated with medicines if weight loss and exercise are not effective.  If you are 45 to 40 years old, ask your caregiver if you should take aspirin to prevent heart disease.  Diabetes screening involves taking a blood sample to check your fasting blood sugar level. This should be done once every 3 years, after age 45, if you are within normal weight and without risk factors for diabetes. Testing should be considered at a younger age or be carried out more frequently if you are overweight and have at least 1 risk factor for diabetes.  Colorectal cancer can be detected and often prevented. Most routine colorectal cancer screening begins at the age of 50 and continues through age 75. However, your caregiver may recommend screening at an earlier age if you have risk factors for colon cancer. On a yearly basis, your caregiver may provide home test kits to check for hidden blood in the stool. Use of a small camera at the end of a tube,   to directly examine the colon (sigmoidoscopy or colonoscopy), can detect the earliest forms of colorectal cancer. Talk to your caregiver about this at age 50, when routine screening begins. Direct examination of the colon should be repeated every 5 to 10 years through age 75, unless early forms of pre-cancerous polyps or small growths are found.  Hepatitis C blood testing is recommended for all people born from 1945 through 1965 and any individual with known risks for hepatitis C.  Healthy  men should no longer receive prostate-specific antigen (PSA) blood tests as part of routine cancer screening. Consult with your caregiver about prostate cancer screening.  Testicular cancer screening is not recommended for adolescents or adult males who have no symptoms. Screening includes self-exam, caregiver exam, and other screening tests. Consult with your caregiver about any symptoms you have or any concerns you have about testicular cancer.  Practice safe sex. Use condoms and avoid high-risk sexual practices to reduce the spread of sexually transmitted infections (STIs).  Use sunscreen with a sun protection factor (SPF) of 30 or greater. Apply sunscreen liberally and repeatedly throughout the day. You should seek shade when your shadow is shorter than you. Protect yourself by wearing long sleeves, pants, a wide-brimmed hat, and sunglasses year round, whenever you are outdoors.  Notify your caregiver of new moles or changes in moles, especially if there is a change in shape or color. Also notify your caregiver if a mole is larger than the size of a pencil eraser.  A one-time screening for abdominal aortic aneurysm (AAA) and surgical repair of large AAAs by sound wave imaging (ultrasonography) is recommended for ages 65 to 75 years who are current or former smokers.  Stay current with your immunizations. Document Released: 03/21/2008 Document Revised: 12/16/2011 Document Reviewed: 02/18/2011 [employer] Patient Information 2013 [employer].  

## 2012-08-03 NOTE — Progress Notes (Signed)
Subjective:    Patient ID: Samuel Meza, male    DOB: 01-20-1971, 41 y.o.   MRN: [id number]  Thyroid Problem Presents for follow-up visit. Symptoms include anxiety, constipation, fatigue and weight loss. Patient reports no cold intolerance, depressed mood, diaphoresis, diarrhea, dry skin, hair loss, heat intolerance, hoarse voice, leg swelling, menstrual problem, nail problem, palpitations, tremors, visual change or weight gain. The symptoms have been stable. His past medical history is significant for hyperlipidemia. There is no history of diabetes.  Hyperlipidemia This is a chronic problem. The current episode started more than 1 year ago. The problem is controlled. Recent lipid tests were reviewed and are variable. Exacerbating diseases include hypothyroidism. He has no history of chronic renal disease, diabetes, liver disease, obesity or nephrotic syndrome. There are no known factors aggravating his hyperlipidemia. Associated symptoms include myalgias. Pertinent negatives include no chest pain, focal sensory loss, focal weakness, leg pain or shortness of breath. Current antihyperlipidemic treatment includes statins. The current treatment provides significant improvement of lipids. There are no compliance problems.       Review of Systems  Constitutional: Positive for weight loss and fatigue. Negative for fever, chills, weight gain, diaphoresis, activity change, appetite change and unexpected weight change.  HENT: Negative.  Negative for hoarse voice.   Eyes: Negative.   Respiratory: Negative for cough, choking, chest tightness, shortness of breath, wheezing and stridor.   Cardiovascular: Negative for chest pain, palpitations and leg swelling.  Gastrointestinal: Positive for constipation. Negative for nausea, vomiting, abdominal pain, diarrhea, blood in stool, abdominal distention, anal bleeding and rectal pain.  Genitourinary: Negative.  Negative for menstrual problem.  Musculoskeletal: Positive  for myalgias and arthralgias. Negative for back pain, joint swelling and gait problem.  Skin: Negative for color change, pallor, rash and wound.  Neurological: Negative for dizziness, tremors, focal weakness, seizures, syncope, facial asymmetry, speech difficulty, weakness, light-headedness, numbness and headaches.  Hematological: Negative for cold intolerance, heat intolerance and adenopathy. Does not bruise/bleed easily.  Psychiatric/Behavioral: Negative.        Objective:   Physical Exam  Vitals reviewed. Constitutional: He is oriented to person, place, and time. He appears well-developed and well-nourished. No distress.  HENT:  Head: Normocephalic and atraumatic.  Mouth/Throat: Oropharynx is clear and moist. No oropharyngeal exudate.  Eyes: Conjunctivae normal are normal. Right eye exhibits no discharge. Left eye exhibits no discharge. No scleral icterus.  Neck: Normal range of motion. Neck supple. No JVD present. No tracheal deviation present. No thyromegaly present.  Cardiovascular: Normal rate, regular rhythm, normal heart sounds and intact distal pulses.  Exam reveals no gallop and no friction rub.   No murmur heard. Pulmonary/Chest: Effort normal and breath sounds normal. No stridor. No respiratory distress. He has no wheezes. He has no rales. He exhibits no tenderness.  Abdominal: Soft. Bowel sounds are normal. He exhibits no distension and no mass. There is no tenderness. There is no rebound and no guarding. Hernia confirmed negative in the right inguinal area and confirmed negative in the left inguinal area.  Genitourinary: Rectum normal, prostate normal and penis normal. Rectal exam shows no external hemorrhoid, no internal hemorrhoid, no fissure, no mass, no tenderness and anal tone normal. Guaiac negative stool. Prostate is not enlarged and not tender. Right testis shows no mass, no swelling and no tenderness. Right testis is descended. Left testis shows no mass, no swelling and  no tenderness. Left testis is descended. Uncircumcised. No phimosis, paraphimosis, hypospadias, penile erythema or penile tenderness. No discharge found.  Musculoskeletal: Normal range of motion. He exhibits no edema and no tenderness.  Lymphadenopathy:    He has no cervical adenopathy.       Right: No inguinal adenopathy present.       Left: No inguinal adenopathy present.  Neurological: He is alert and oriented to person, place, and time. He has normal reflexes. He displays normal reflexes. No cranial nerve deficit. He exhibits normal muscle tone. Coordination normal.  Skin: Skin is warm and dry. No rash noted. He is not diaphoretic. No erythema. No pallor.  Psychiatric: He has a normal mood and affect. His behavior is normal. Judgment and thought content normal.      Lab Results  Component Value Date   WBC 11.0* 04/12/2012   HGB 14.3 04/12/2012   HCT 46.6 04/12/2012   GLUCOSE 81 02/25/2012   CHOL 146 04/12/2012   TRIG 147 04/12/2012   HDL 48 04/12/2012   LDLCALC 69 04/12/2012   NA 140 02/25/2012   K 4.6 02/25/2012   CL 102 02/25/2012   CREATININE 0.87 02/25/2012   BUN 13 02/25/2012   CO2 30 02/25/2012   TSH 0.151* 04/12/2012      Assessment & Plan:

## 2012-08-03 NOTE — Assessment & Plan Note (Signed)
Exam done, vaccines were reviewed, labs ordered, pt ed material was given 

## 2012-08-03 NOTE — Assessment & Plan Note (Signed)
Stop the statin, check his CPK level today

## 2012-08-03 NOTE — Assessment & Plan Note (Signed)
I will check his renal function and his uric acid level today 

## 2012-08-03 NOTE — Assessment & Plan Note (Signed)
I will check his TFT's to see if his dose is accurate

## 2012-08-03 NOTE — Assessment & Plan Note (Signed)
A1C today to see if he has developed DM II

## 2012-08-19 ENCOUNTER — Other Ambulatory Visit: Payer: Self-pay

## 2012-08-19 MED ORDER — LEVOTHYROXINE SODIUM 75 MCG PO TABS: 75.0000 ug | ORAL_TABLET | Freq: Every day | ORAL | Status: DC

## 2012-08-20 ENCOUNTER — Other Ambulatory Visit: Payer: Self-pay

## 2012-10-07 HISTORY — PX: HEMORRHOID BANDING: SHX5850

## 2012-10-07 HISTORY — PX: COLONOSCOPY: SHX174

## 2012-12-15 ENCOUNTER — Telehealth: Payer: Self-pay | Admitting: *Deleted

## 2012-12-15 NOTE — Telephone Encounter (Signed)
Left msg on triage stating brother use to see Dr. Yetta Barre & he want to start seeing Dr. Jonny Ruiz. Called sister back no answer LMOM for her to contact our schedulers to see will md take him on as a new pt,,,/lmb

## 2012-12-17 ENCOUNTER — Encounter: Payer: Self-pay | Admitting: Internal Medicine

## 2012-12-17 ENCOUNTER — Telehealth: Payer: Self-pay | Admitting: Internal Medicine

## 2012-12-17 ENCOUNTER — Ambulatory Visit (INDEPENDENT_AMBULATORY_CARE_PROVIDER_SITE_OTHER): Payer: BC Managed Care – PPO | Admitting: Internal Medicine

## 2012-12-17 ENCOUNTER — Ambulatory Visit (INDEPENDENT_AMBULATORY_CARE_PROVIDER_SITE_OTHER)
Admission: RE | Admit: 2012-12-17 | Discharge: 2012-12-17 | Disposition: A | Discharge status: Home / Self Care | Payer: BC Managed Care – PPO | Source: Ambulatory Visit | Attending: Internal Medicine | Admitting: Internal Medicine

## 2012-12-17 VITALS — BP 110/70 | HR 64 | Temp 97.5°F | Resp 16 | Wt 131.0 lb

## 2012-12-17 DIAGNOSIS — M79609 Pain in unspecified limb: Secondary | ICD-10-CM

## 2012-12-17 DIAGNOSIS — M79673 Pain in unspecified foot: Secondary | ICD-10-CM | POA: Insufficient documentation

## 2012-12-17 DIAGNOSIS — M79672 Pain in left foot: Secondary | ICD-10-CM

## 2012-12-17 MED ORDER — NAPROXEN 375 MG PO TABS: 375.0000 mg | ORAL_TABLET | Freq: Two times a day (BID) | ORAL | Status: DC

## 2012-12-17 NOTE — Assessment & Plan Note (Signed)
He has tried mobic and ibuprofen, I have offered naprosyn instead I will check a plain film today to look for spurring, djd, bone tumor I don't think this is gout (colchicine has not helped) I asked him to try PT for exercise modalities, ionophoresis, etc. -  but he is NOT willing to do PT at this time

## 2012-12-17 NOTE — Patient Instructions (Signed)
Foot Sprain The muscles and cord like structures which attach muscle to bone (tendons) that surround the feet are made up of units. A foot sprain can occur at the weakest spot in any of these units. This condition is most often caused by injury to or overuse of the foot, as from playing contact sports, or aggravating a previous injury, or from poor conditioning, or obesity. SYMPTOMS  Pain with movement of the foot.  Tenderness and swelling at the injury site.  Loss of strength is present in moderate or severe sprains. THE THREE GRADES OR SEVERITY OF FOOT SPRAIN ARE:  Mild (Grade I): Slightly pulled muscle without tearing of muscle or tendon fibers or loss of strength.  Moderate (Grade II): Tearing of fibers in a muscle, tendon, or at the attachment to bone, with small decrease in strength.  Severe (Grade III): Rupture of the muscle-tendon-bone attachment, with separation of fibers. Severe sprain requires surgical repair. Often repeating (chronic) sprains are caused by overuse. Sudden (acute) sprains are caused by direct injury or over-use. DIAGNOSIS  Diagnosis of this condition is usually by your own observation. If problems continue, a caregiver may be required for further evaluation and treatment. X-rays may be required to make sure there are not breaks in the bones (fractures) present. Continued problems may require physical therapy for treatment. PREVENTION  Use strength and conditioning exercises appropriate for your sport.  Warm up properly prior to working out.  Use athletic shoes that are made for the sport you are participating in.  Allow adequate time for healing. Early return to activities makes repeat injury more likely, and can lead to an unstable arthritic foot that can result in prolonged disability. Mild sprains generally heal in 3 to 10 days, with moderate and severe sprains taking 2 to 10 weeks. Your caregiver can help you determine the proper time required for  healing. HOME CARE INSTRUCTIONS   Apply ice to the injury for 15 to 20 minutes, 3 to 4 times per day. Put the ice in a plastic bag and place a towel between the bag of ice and your skin.  An elastic wrap (like an Ace bandage) may be used to keep swelling down.  Keep foot above the level of the heart, or at least raised on a footstool, when swelling and pain are present.  Try to avoid use other than gentle range of motion while the foot is painful. Do not resume use until instructed by your caregiver. Then begin use gradually, not increasing use to the point of pain. If pain does develop, decrease use and continue the above measures, gradually increasing activities that do not cause discomfort, until you gradually achieve normal use.  Use crutches if and as instructed, and for the length of time instructed.  Keep injured foot and ankle wrapped between treatments.  Massage foot and ankle for comfort and to keep swelling down. Massage from the toes up towards the knee.  Only take over-the-counter or prescription medicines for pain, discomfort, or fever as directed by your caregiver. SEEK IMMEDIATE MEDICAL CARE IF:   Your pain and swelling increase, or pain is not controlled with medications.  You have loss of feeling in your foot or your foot turns cold or blue.  You develop new, unexplained symptoms, or an increase of the symptoms that brought you to your caregiver. MAKE SURE YOU:   Understand these instructions.  Will watch your condition.  Will get help right away if you are not doing well or   get worse. Document Released: 03/15/2002 Document Revised: 12/16/2011 Document Reviewed: 05/12/2008 [employer] Patient Information 2013 [employer].  

## 2012-12-17 NOTE — Telephone Encounter (Signed)
Patient Information:  Caller Name: Samuel Meza  Phone: [phone]  Patient: Samuel, Meza  Gender: Male  DOB: [birth year]  Age: 42 Years  PCP: Sanda Linger (Adults only)  Office Follow Up:  Does the office need to follow up with this patient?: No  Instructions For The Office: N/A  RN Note:  Affected foot is left.  Has history of gout. Has been on Allopurinol for prevention, but has a flare for the first time in a while. Yesterday he missed work to see if it would get better.  Today is much worse.  Scheduled appointment with PCP at 09:15 on 12/17/12.  Caller demonstrated understanding.  Symptoms  Reason For Call & Symptoms: Hx of gout and has not had an episode in a while, but is having one now  since yesterday--can't walk today  Reviewed Health History In EMR: Yes  Reviewed Medications In EMR: Yes  Reviewed Allergies In EMR: Yes  Reviewed Surgeries / Procedures: Yes  Date of Onset of Symptoms: 12/16/2012  Treatments Tried: Allopurinol as preventive  Treatments Tried Worked: No  Guideline(s) Used:  Foot Pain  Disposition Per Guideline:   See Today in Office  Reason For Disposition Reached:   Severe pain (e.g., excruciating, unable to do any normal activities)  Advice Given:  Call Back If:  You become worse.  Appointment Scheduled:  12/17/2012 09:15:00 Appointment Scheduled Provider:  Sanda Linger (Adults only)

## 2012-12-17 NOTE — Progress Notes (Signed)
  Subjective:    Patient ID: Samuel Meza, male    DOB: 1971/04/13, 42 y.o.   MRN: [id number]  Arthritis Presents for follow-up visit. He complains of pain. He reports no stiffness, joint swelling or joint warmth. Affected locations include the left foot. His pain is at a severity of 2/10. Pertinent negatives include no diarrhea, dry eyes, dry mouth, dysuria, fatigue, fever, pain at night, pain while resting, rash, Raynaud's syndrome, uveitis or weight loss. Compliance with total regimen is 76-100%.      Review of Systems  Constitutional: Negative.  Negative for fever, weight loss and fatigue.  HENT: Negative.   Eyes: Negative.   Respiratory: Negative.   Cardiovascular: Negative.   Gastrointestinal: Negative.  Negative for diarrhea.  Endocrine: Negative.   Genitourinary: Negative.  Negative for dysuria.  Musculoskeletal: Positive for arthralgias (only in left foot/ankle) and arthritis. Negative for joint swelling and stiffness.  Skin: Negative for rash.  Allergic/Immunologic: Negative.   Neurological: Negative.   Hematological: Negative.   Psychiatric/Behavioral: Negative.        Objective:   Physical Exam  Musculoskeletal:       Left ankle: He exhibits normal range of motion, no swelling, no ecchymosis, no deformity, no laceration and normal pulse. No tenderness. No lateral malleolus and no medial malleolus tenderness found. Achilles tendon exhibits no pain, no defect and normal Thompson's test results.       Left foot: He exhibits tenderness and bony tenderness. He exhibits normal range of motion, no swelling, normal capillary refill, no crepitus, no deformity and no laceration.       Feet:      Lab Results  Component Value Date   WBC 7.0 08/03/2012   HGB 14.8 08/03/2012   HCT 45.1 08/03/2012   PLT 287.0 08/03/2012   GLUCOSE 87 08/03/2012   CHOL 139 08/03/2012   TRIG 154.0* 08/03/2012   HDL 32.50* 08/03/2012   LDLCALC 76 08/03/2012   ALT 34 08/03/2012   AST 25  08/03/2012   NA 139 08/03/2012   K 4.1 08/03/2012   CL 103 08/03/2012   CREATININE 0.9 08/03/2012   BUN 18 08/03/2012   CO2 31 08/03/2012   TSH 2.37 08/03/2012   PSA 1.37 08/03/2012   HGBA1C 6.0 08/03/2012      Assessment & Plan:

## 2013-04-13 ENCOUNTER — Other Ambulatory Visit: Payer: Self-pay | Admitting: Emergency Medicine

## 2013-04-14 ENCOUNTER — Other Ambulatory Visit: Payer: Self-pay | Admitting: Emergency Medicine

## 2013-04-22 ENCOUNTER — Encounter: Payer: Self-pay | Admitting: Internal Medicine

## 2013-04-22 ENCOUNTER — Ambulatory Visit (INDEPENDENT_AMBULATORY_CARE_PROVIDER_SITE_OTHER): Payer: BC Managed Care – PPO | Admitting: Internal Medicine

## 2013-04-22 ENCOUNTER — Other Ambulatory Visit (INDEPENDENT_AMBULATORY_CARE_PROVIDER_SITE_OTHER): Payer: BC Managed Care – PPO

## 2013-04-22 VITALS — BP 114/74 | HR 68 | Temp 97.7°F | Resp 16 | Ht 64.0 in | Wt 125.0 lb

## 2013-04-22 DIAGNOSIS — M109 Gout, unspecified: Secondary | ICD-10-CM

## 2013-04-22 DIAGNOSIS — K5900 Constipation, unspecified: Secondary | ICD-10-CM

## 2013-04-22 DIAGNOSIS — E039 Hypothyroidism, unspecified: Secondary | ICD-10-CM

## 2013-04-22 DIAGNOSIS — Z23 Encounter for immunization: Secondary | ICD-10-CM

## 2013-04-22 DIAGNOSIS — R7309 Other abnormal glucose: Secondary | ICD-10-CM

## 2013-04-22 DIAGNOSIS — K921 Melena: Secondary | ICD-10-CM

## 2013-04-22 LAB — COMPREHENSIVE METABOLIC PANEL
ALT: 28 U/L (ref 0–53)
Albumin: 4.5 g/dL (ref 3.5–5.2)
BUN: 15 mg/dL (ref 6–23)
CO2: 29 mEq/L (ref 19–32)
Calcium: 9.7 mg/dL (ref 8.4–10.5)
Chloride: 101 mEq/L (ref 96–112)
Creatinine, Ser: 0.9 mg/dL (ref 0.4–1.5)
GFR: 105.51 mL/min (ref >60.00)

## 2013-04-22 LAB — HEMOGLOBIN A1C: Hgb A1c MFr Bld: 5.8 % (ref 4.6–6.5)

## 2013-04-22 LAB — SEDIMENTATION RATE: Sed Rate: 9 mm/hr (ref 0–22)

## 2013-04-22 LAB — CBC WITH DIFFERENTIAL/PLATELET
Basophils Absolute: 0 10*3/uL (ref 0.0–0.1)
Hemoglobin: 16.2 g/dL (ref 13.0–17.0)
Lymphocytes Relative: 22.4 % (ref 12.0–46.0)
Monocytes Relative: 6.2 % (ref 3.0–12.0)
Neutro Abs: 6.9 10*3/uL (ref 1.4–7.7)
Neutrophils Relative %: 70 % (ref 43.0–77.0)
RDW: 12.9 % (ref 11.5–14.6)

## 2013-04-22 LAB — URIC ACID: Uric Acid, Serum: 4.8 mg/dL (ref 4.0–7.8)

## 2013-04-22 LAB — TSH: TSH: 2.2 u[IU]/mL (ref 0.35–5.50)

## 2013-04-22 MED ORDER — ALLOPURINOL 300 MG PO TABS: 300.0000 mg | ORAL_TABLET | Freq: Every day | ORAL | Status: DC

## 2013-04-22 MED ORDER — LINACLOTIDE 145 MCG PO CAPS: 145.0000 ug | ORAL_CAPSULE | Freq: Every day | ORAL | Status: DC

## 2013-04-22 MED ORDER — COLCHICINE 0.6 MG PO TABS: 0.6000 mg | ORAL_TABLET | Freq: Every day | ORAL | Status: DC

## 2013-04-22 NOTE — Patient Instructions (Signed)

## 2013-04-22 NOTE — Progress Notes (Signed)
Subjective:    Patient ID: Samuel Meza, male    DOB: [birth year], 42 y.o.   MRN: [id number]  Constipation This is a chronic problem. The current episode started more than 1 year ago (for 3-4 years, seen in [place] about this). The problem is unchanged. His stool frequency is 2 to 3 times per week. The stool is described as firm, formed and blood tinged. The patient is on a high fiber diet. He exercises regularly. There has been adequate water intake. Associated symptoms include hematochezia. Pertinent negatives include no abdominal pain, anorexia, back pain, bloating, diarrhea, difficulty urinating, fecal incontinence, fever, flatus, hemorrhoids, melena, nausea, rectal pain, vomiting or weight loss. He has tried enemas for the symptoms. The treatment provided no relief. His past medical history is significant for endocrine disease. There is no history of abdominal surgery, inflammatory bowel disease, irritable bowel syndrome, metabolic disease, neurologic disease, neuromuscular disease, psychiatric history or radiation treatment.      Review of Systems  Constitutional: Positive for fatigue and unexpected weight change (some weight loss). Negative for fever, chills, weight loss, diaphoresis, activity change and appetite change.  HENT: Negative.   Eyes: Negative.   Respiratory: Negative.  Negative for cough, chest tightness, shortness of breath, wheezing and stridor.   Cardiovascular: Negative.  Negative for chest pain, palpitations and leg swelling.  Gastrointestinal: Positive for constipation, hematochezia and anal bleeding. Negative for nausea, vomiting, abdominal pain, diarrhea, melena, abdominal distention, rectal pain, bloating, anorexia, flatus and hemorrhoids.  Endocrine: Negative.   Genitourinary: Negative.  Negative for difficulty urinating.  Musculoskeletal: Negative.  Negative for back pain.  Skin: Negative.   Allergic/Immunologic: Negative.   Neurological: Negative.   Hematological:  Negative.   Psychiatric/Behavioral: Negative.        Objective:   Physical Exam  Vitals reviewed. Constitutional: He is oriented to person, place, and time. He appears well-developed and well-nourished. No distress.  HENT:  Head: Normocephalic and atraumatic.  Mouth/Throat: Oropharynx is clear and moist. No oropharyngeal exudate.  Eyes: Conjunctivae are normal. Right eye exhibits no discharge. Left eye exhibits no discharge. No scleral icterus.  Neck: Normal range of motion. Neck supple. No JVD present. No tracheal deviation present. No thyromegaly present.  Cardiovascular: Normal rate, regular rhythm, normal heart sounds and intact distal pulses.  Exam reveals no gallop and no friction rub.   No murmur heard. Pulmonary/Chest: Effort normal and breath sounds normal. No stridor. No respiratory distress. He has no wheezes. He has no rales. He exhibits no tenderness.  Abdominal: Soft. Bowel sounds are normal. He exhibits no distension and no mass. There is no tenderness. There is no rebound and no guarding. Hernia confirmed negative in the right inguinal area and confirmed negative in the left inguinal area.  Genitourinary: Prostate normal, testes normal and penis normal. Rectal exam shows internal hemorrhoid and mass (questionable polyp in the rectum). Rectal exam shows no external hemorrhoid, no fissure, no tenderness and anal tone normal. Guaiac negative stool. Prostate is not enlarged and not tender. Right testis shows no mass, no swelling and no tenderness. Right testis is descended. Left testis shows no mass, no swelling and no tenderness. Left testis is descended. Uncircumcised. No phimosis, paraphimosis, hypospadias, penile erythema or penile tenderness. No discharge found.  Musculoskeletal: Normal range of motion. He exhibits no edema and no tenderness.  Lymphadenopathy:    He has no cervical adenopathy.       Right: No inguinal adenopathy present.       Left: No inguinal  adenopathy  present.  Neurological: He is oriented to person, place, and time.  Skin: Skin is warm and dry. No rash noted. He is not diaphoretic. No erythema. No pallor.  Psychiatric: He has a normal mood and affect. His behavior is normal. Judgment and thought content normal.      Lab Results  Component Value Date   WBC 7.0 08/03/2012   HGB 14.8 08/03/2012   HCT 45.1 08/03/2012   PLT 287.0 08/03/2012   GLUCOSE 87 08/03/2012   CHOL 139 08/03/2012   TRIG 154.0* 08/03/2012   HDL 32.50* 08/03/2012   LDLCALC 76 08/03/2012   ALT 34 08/03/2012   AST 25 08/03/2012   NA 139 08/03/2012   K 4.1 08/03/2012   CL 103 08/03/2012   CREATININE 0.9 08/03/2012   BUN 18 08/03/2012   CO2 31 08/03/2012   TSH 2.37 08/03/2012   PSA 1.37 08/03/2012   HGBA1C 6.0 08/03/2012      Assessment & Plan:

## 2013-04-22 NOTE — Assessment & Plan Note (Signed)
Will check his A1C today to see if he has DM2

## 2013-04-22 NOTE — Assessment & Plan Note (Signed)
He has not had any flares I will check his uric acid level and renal function today

## 2013-04-22 NOTE — Assessment & Plan Note (Signed)
Start Linzess I will check his labs today to look for secondary causes There are no records or info from his prior evaluation for this in [place]

## 2013-04-22 NOTE — Assessment & Plan Note (Signed)
I have asked him to see GI to consider getting a colonoscopy done

## 2013-04-22 NOTE — Assessment & Plan Note (Signed)
I will check his TSH today and will adjust his dose if needed 

## 2013-05-10 ENCOUNTER — Telehealth: Payer: Self-pay | Admitting: *Deleted

## 2013-05-10 ENCOUNTER — Encounter: Payer: Self-pay | Admitting: Gastroenterology

## 2013-05-10 NOTE — Telephone Encounter (Signed)
Pt.notified

## 2013-05-10 NOTE — Telephone Encounter (Signed)
The two medicines treat gout differently If they are too expensive then don't get them

## 2013-05-10 NOTE — Telephone Encounter (Signed)
Pts sister states they were not aware Colchicine was being called in.  Further states the medication is too expensive.  Wants to know if it is necessary and what indication made it necessary to have two Rx for Gout medications.  Also states the Linzess was too expensive.  Please advise

## 2013-05-15 ENCOUNTER — Other Ambulatory Visit: Payer: Self-pay | Admitting: Emergency Medicine

## 2013-05-29 ENCOUNTER — Encounter: Payer: Self-pay | Admitting: Internal Medicine

## 2013-05-29 ENCOUNTER — Ambulatory Visit (INDEPENDENT_AMBULATORY_CARE_PROVIDER_SITE_OTHER): Payer: BC Managed Care – PPO | Admitting: Internal Medicine

## 2013-05-29 VITALS — BP 120/68 | Temp 99.1°F | Wt 125.0 lb

## 2013-05-29 DIAGNOSIS — R339 Retention of urine, unspecified: Secondary | ICD-10-CM

## 2013-05-29 DIAGNOSIS — R509 Fever, unspecified: Secondary | ICD-10-CM

## 2013-05-29 LAB — POCT URINALYSIS DIPSTICK
Glucose, UA: NEGATIVE
Ketones, UA: NEGATIVE
Spec Grav, UA: <=1.005

## 2013-05-29 MED ORDER — CIPROFLOXACIN HCL 500 MG PO TABS: 500.0000 mg | ORAL_TABLET | Freq: Two times a day (BID) | ORAL | Status: DC

## 2013-05-29 NOTE — Assessment & Plan Note (Signed)
Has had systemic symptoms for a few weeks which are worsened Specific urinary symptoms and abdominal tenderness --- urinalysis negative for nitrites and leuks. Positive blood but couldn't do micro to specify  Total picture is consistent with prostatitis Will treat for 3 weeks May need follow up if symptoms are clearing in the next week or so

## 2013-05-29 NOTE — Progress Notes (Signed)
Subjective:    Patient ID: Samuel Meza, male    DOB: Jun 06, 1971, 42 y.o.   MRN: [id number]  HPI Sister is here to translate For past 4-5 days, feels weak all over Fever and chills (no recorded temp)  Feels like he is not emptying his bladder Some pain when he voids This goes on for 3 weeks or so Some discomfort even without voiding Some upper back pain  No cough  Pain in back can be worse with breathing (makes it hard to breath when worse--not often)  Married No pain with intercourse---no ejaculation  Current Outpatient Prescriptions on File Prior to Visit  Medication Sig Dispense Refill  . allopurinol (ZYLOPRIM) 300 MG tablet Take 1 tablet (300 mg total) by mouth daily.  90 tablet  1  . colchicine 0.6 MG tablet Take 1 tablet (0.6 mg total) by mouth daily.  180 tablet  1  . levothyroxine (SYNTHROID, LEVOTHROID) 75 MCG tablet Take 1 tablet (75 mcg total) by mouth daily.  30 tablet  5  . Linaclotide (LINZESS) 145 MCG CAPS Take 1 capsule (145 mcg total) by mouth daily.  90 capsule  3  . simvastatin (ZOCOR) 20 MG tablet TAKE ONE TABLET BY MOUTH IN THE EVENING  30 tablet  0  . simvastatin (ZOCOR) 20 MG tablet TAKE ONE TABLET BY MOUTH IN THE EVENING  30 tablet  0   No current facility-administered medications on file prior to visit.    Allergies  Allergen Reactions  . Simvastatin     Past Medical History  Diagnosis Date  . Hyperlipidemia   . Thyroid disease   . Gout   . Diabetes mellitus without complication     Past Surgical History  Procedure Laterality Date  . Tonsillectomy  1980    Family History  Problem Relation Age of Onset  . Hypertension Other   . Hyperlipidemia Other   . Hyperlipidemia Mother   . Hypertension Mother   . Hyperlipidemia Father   . Hypertension Father   . Alcohol abuse Neg Hx   . Cancer Neg Hx   . Diabetes Neg Hx   . Early death Neg Hx   . Heart disease Neg Hx   . Kidney disease Neg Hx   . Stroke Neg Hx     History   Social History   . Marital Status: Single    Spouse Name: N/A    Number of Children: N/A  . Years of Education: N/A   Occupational History  . Not on file.   Social History Main Topics  . Smoking status: Never Smoker   . Smokeless tobacco: Never Used  . Alcohol Use: No     Comment: ocas  . Drug Use: No  . Sexual Activity: Not Currently   Other Topics Concern  . Not on file   Social History Narrative  . No narrative on file   Review of Systems No sig skin rash Slight runny nose and congestion but not bad No nausea or vomiting Appetite is off    Objective:   Physical Exam  Constitutional: He appears well-developed and well-nourished. No distress.  HENT:  Mouth/Throat: Oropharynx is clear and moist. No oropharyngeal exudate.  TMs normal No sinus tenderness   Neck: Normal range of motion. Neck supple. No thyromegaly present.  Pulmonary/Chest: Effort normal and breath sounds normal. No respiratory distress. He has no wheezes. He has no rales.  Abdominal: Soft. He exhibits no distension. There is tenderness. There is no rebound and  no guarding.  Mild generalized tenderness but fairly striking suprapubic  Genitourinary:  No hemorrhoids Prostate not boggy or significantly enlarged but is moderately tender  Musculoskeletal: He exhibits no edema and no tenderness.  Lymphadenopathy:    He has no cervical adenopathy.  Skin: No rash noted.  Psychiatric: He has a normal mood and affect. His behavior is normal.          Assessment & Plan:

## 2013-06-01 ENCOUNTER — Ambulatory Visit (INDEPENDENT_AMBULATORY_CARE_PROVIDER_SITE_OTHER): Payer: BC Managed Care – PPO | Admitting: Gastroenterology

## 2013-06-01 ENCOUNTER — Encounter: Payer: Self-pay | Admitting: Gastroenterology

## 2013-06-01 VITALS — BP 90/60 | HR 72 | Ht 63.5 in | Wt 124.0 lb

## 2013-06-01 DIAGNOSIS — Z860100 Personal history of colon polyps, unspecified: Secondary | ICD-10-CM

## 2013-06-01 DIAGNOSIS — K649 Unspecified hemorrhoids: Secondary | ICD-10-CM

## 2013-06-01 DIAGNOSIS — K3189 Other diseases of stomach and duodenum: Secondary | ICD-10-CM

## 2013-06-01 DIAGNOSIS — R1013 Epigastric pain: Secondary | ICD-10-CM

## 2013-06-01 DIAGNOSIS — K625 Hemorrhage of anus and rectum: Secondary | ICD-10-CM

## 2013-06-01 MED ORDER — NA SULFATE-K SULFATE-MG SULF 17.5-3.13-1.6 GM/177ML PO SOLN: ORAL | Status: DC

## 2013-06-01 MED ORDER — PRAMOXINE-HC 1-1 % EX CREA: TOPICAL_CREAM | Freq: Every day | CUTANEOUS | Status: DC

## 2013-06-01 NOTE — Patient Instructions (Addendum)
You have been scheduled for an endoscopy and colonoscopy with propofol. Please follow the written instructions given to you at your visit today. Please pick up your prep at the pharmacy within the next 1-3 days. If you use inhalers (even only as needed), please bring them with you on the day of your procedure. Your physician has requested that you go to www.[email or web address] and enter the access code given to you at your visit today. This web site gives a general overview about your procedure. However, you should still follow specific instructions given to you by our office regarding your preparation for the procedure.  Please purchase Miralax over the counter and mix eight ounces in water to drink at bedtime.  Please purchase Benefiber over the counter and take one tablespoon mixed in water or juice twice daily.  We have sent the following medications to your pharmacy for you to pick up at your convenience: Analpram, use as directed at bedtime  ____________________________________________________________________________________

## 2013-06-01 NOTE — Progress Notes (Signed)
History of Present Illness:  This is a 42 year old [place] male self referred for evaluation of daily rectal bleeding for several months.  He apparently had colonoscopy performed 10 years ago with removal of a colon polyp.  He has chronic functional constipation, loss of appetite, dyspepsia, and rectal bleeding daily.  Family history noncontributory.  Review of labs shows no evidence of anemia or abnormal liver function tests.  Follows a regular diet and denies a specific food intolerances.  Patient denies rectal pain or any significant abdominal pain, gas, bloating or distention.  I have reviewed this patient's present history, medical and surgical past history, allergies and medications.     ROS:   All systems were reviewed and are negative unless otherwise stated in the HPI.    Physical Exam: Blood pressure 114/74, pulse 60 and regular and weight 125 with a BMI of 21.45. General well developed well nourished patient in no acute distress, appearing their stated age Eyes PERRLA, no icterus, fundoscopic exam per opthamologist Skin no lesions noted Neck supple, no adenopathy, no thyroid enlargement, no tenderness Chest clear to percussion and auscultation Heart no significant murmurs, gallops or rubs noted Abdomen no hepatosplenomegaly masses or tenderness, BS normal.  Rectal inspection normal no fissures, or fistulae noted.  No masses or tenderness on digital exam.  This patient has a left posterior prolapsing internal hemorrhoid without evidence of recent bleeding.  I cannot appreciate any fissures or fistulae.  Digital exam shows no masses or tenderness, and there is no stool present for guaiac testing. Extremities no acute joint lesions, edema, phlebitis or evidence of cellulitis. Neurologic patient oriented x 3, cranial nerves intact, no focal neurologic deficits noted. Psychological mental status normal and normal affect.  Assessment and plan: Probable hemorrhoidal bleeding, rule out  recurrent colonic polyposis.  Patient also has chronic dyspepsia and wants to be checked for H. pylori infection.  I've scheduled him for endoscopy and colonoscopy his convenience.  I've asked him to use Benefiber twice a day with nightly MiraLax and also at bedtime Analpram cream pending further evaluation.  His workup is negative, he would be a good candidate for hemorrhoid banding by Dr. Leone Payor

## 2013-06-02 ENCOUNTER — Telehealth: Payer: Self-pay | Admitting: Internal Medicine

## 2013-06-02 NOTE — Telephone Encounter (Signed)
yes

## 2013-06-02 NOTE — Telephone Encounter (Signed)
Pt is requesting to switch PCP from Dr. Yetta Barre to Dr. Jonny Ruiz.  Dr. Jonny Ruiz gave a verbal OK.

## 2013-06-03 NOTE — Telephone Encounter (Signed)
LMOM to call.

## 2013-06-08 NOTE — Telephone Encounter (Signed)
Sister is aware and made an appt with Dr. Jonny Ruiz for the patient.

## 2013-06-14 ENCOUNTER — Ambulatory Visit (AMBULATORY_SURGERY_CENTER): Payer: BC Managed Care – PPO | Admitting: Gastroenterology

## 2013-06-14 ENCOUNTER — Encounter: Payer: Self-pay | Admitting: Gastroenterology

## 2013-06-14 ENCOUNTER — Telehealth: Payer: Self-pay | Admitting: Gastroenterology

## 2013-06-14 VITALS — BP 101/59 | HR 87 | Temp 98.6°F | Resp 22 | Ht 63.5 in | Wt 124.0 lb

## 2013-06-14 DIAGNOSIS — K648 Other hemorrhoids: Secondary | ICD-10-CM

## 2013-06-14 DIAGNOSIS — K921 Melena: Secondary | ICD-10-CM

## 2013-06-14 DIAGNOSIS — K3189 Other diseases of stomach and duodenum: Secondary | ICD-10-CM

## 2013-06-14 DIAGNOSIS — K219 Gastro-esophageal reflux disease without esophagitis: Secondary | ICD-10-CM

## 2013-06-14 DIAGNOSIS — D1330 Benign neoplasm of unspecified part of small intestine: Secondary | ICD-10-CM

## 2013-06-14 DIAGNOSIS — K625 Hemorrhage of anus and rectum: Secondary | ICD-10-CM

## 2013-06-14 MED ORDER — ESOMEPRAZOLE MAGNESIUM 40 MG PO CPDR: 40.0000 mg | DELAYED_RELEASE_CAPSULE | Freq: Every day | ORAL | Status: DC

## 2013-06-14 MED ORDER — SODIUM CHLORIDE 0.9 % IV SOLN: 500.0000 mL | INTRAVENOUS | Status: DC

## 2013-06-14 NOTE — Op Note (Signed)
[facility] [address] [place], [address]   ENDOSCOPY PROCEDURE REPORT  PATIENT: Samuel Meza, Samuel Meza  MR#: [id number] BIRTHDATE: [birth year] , 42  yrs. old GENDER: Male ENDOSCOPIST:Angelle Isais Hale Bogus, MD, [facility] REFERRED BY: PROCEDURE DATE:  06/14/2013 PROCEDURE:   EGD w/ biopsy and EGD w/ biopsy for H.pylori ASA CLASS:    Class II INDICATIONS: Dyspepsia. MEDICATION: There was residual sedation effect present from prior procedure and propofol (Diprivan) 100mg  IV TOPICAL ANESTHETIC:  DESCRIPTION OF PROCEDURE:   After the risks and benefits of the procedure were explained, informed consent was obtained.  The LB [id number]  endoscope was introduced through the mouth  and advanced to the second portion of the duodenum .  The instrument was slowly withdrawn as the mucosa was fully examined.      DUODENUM: The duodenal mucosa showed no abnormalities in the bulb and second portion of the duodenum.  Cold forcep biopsies were taken in the second portion.  STOMACH: The mucosa of the stomach appeared normal.  A CLO biopsy was performed.  ESOPHAGUS: The mucosa of the esophagus appeared normal. Retroflexed views revealed a lagge 5-5 cm. HH- hiatal hernia. The scope was then withdrawn from the patient and the procedure completed.  COMPLICATIONS: There were no complications.   ENDOSCOPIC IMPRESSION: 1.   The duodenal mucosa showed no abnormalities in the bulb and second portion of the duodenum ...biopsies done 2.   The mucosa of the stomach appeared normal; biopsy .Marland KitchenCLO Bx. done 3.   The mucosa of the esophagus appeared normal ..large HH and chronic GERD  RECOMMENDATIONS: Await pathology results Nexium 40 mg/qam reflux regime    _______________________________ eSigned:  Mardella Layman, MD, [facility] 06/14/2013 1:53 PM

## 2013-06-14 NOTE — Progress Notes (Signed)
Called to room to assist during endoscopic procedure.  Patient ID and intended procedure confirmed with present staff. Received instructions for my participation in the procedure from the performing physician.  

## 2013-06-14 NOTE — Progress Notes (Signed)
Patient did not experience any of the following events: a burn prior to discharge; a fall within the facility; wrong site/side/patient/procedure/implant event; or a hospital transfer or hospital admission upon discharge from the facility. (G8907) Patient did not have preoperative order for IV antibiotic SSI prophylaxis. (G8918)  

## 2013-06-14 NOTE — Patient Instructions (Addendum)
Discharge instructions given with verbal understanding. Handouts on a hiatal hernia.gerd and hemorrhoids given. Resume previous medications. YOU HAD AN ENDOSCOPIC PROCEDURE TODAY AT THE [facility]: Refer to the procedure report that was given to you for any specific questions about what was found during the examination.  If the procedure report does not answer your questions, please call your gastroenterologist to clarify.  If you requested that your care partner not be given the details of your procedure findings, then the procedure report has been included in a sealed envelope for you to review at your convenience later.  YOU SHOULD EXPECT: Some feelings of bloating in the abdomen. Passage of more gas than usual.  Walking can help get rid of the air that was put into your GI tract during the procedure and reduce the bloating. If you had a lower endoscopy (such as a colonoscopy or flexible sigmoidoscopy) you may notice spotting of blood in your stool or on the toilet paper. If you underwent a bowel prep for your procedure, then you may not have a normal bowel movement for a few days.  DIET: Your first meal following the procedure should be a light meal and then it is ok to progress to your normal diet.  A half-sandwich or bowl of soup is an example of a good first meal.  Heavy or fried foods are harder to digest and may make you feel nauseous or bloated.  Likewise meals heavy in dairy and vegetables can cause extra gas to form and this can also increase the bloating.  Drink plenty of fluids but you should avoid alcoholic beverages for 24 hours.  ACTIVITY: Your care partner should take you home directly after the procedure.  You should plan to take it easy, moving slowly for the rest of the day.  You can resume normal activity the day after the procedure however you should NOT DRIVE or use heavy machinery for 24 hours (because of the sedation medicines used during the test).    SYMPTOMS TO  REPORT IMMEDIATELY: A gastroenterologist can be reached at any hour.  During normal business hours, 8:30 AM to 5:00 PM Monday through Friday, call [phone].  After hours and on weekends, please call the GI answering service at [phone] who will take a message and have the physician on call contact you.   Following lower endoscopy (colonoscopy or flexible sigmoidoscopy):  Excessive amounts of blood in the stool  Significant tenderness or worsening of abdominal pains  Swelling of the abdomen that is new, acute  Fever of 100F or higher  Following upper endoscopy (EGD)  Vomiting of blood or coffee ground material  New chest pain or pain under the shoulder blades  Painful or persistently difficult swallowing  New shortness of breath  Fever of 100F or higher  Black, tarry-looking stools  FOLLOW UP: If any biopsies were taken you will be contacted by phone or by letter within the next 1-3 weeks.  Call your gastroenterologist if you have not heard about the biopsies in 3 weeks.  Our staff will call the home number listed on your records the next business day following your procedure to check on you and address any questions or concerns that you may have at that time regarding the information given to you following your procedure. This is a courtesy call and so if there is no answer at the home number and we have not heard from you through the emergency physician on call, we will assume  that you have returned to your regular daily activities without incident.  SIGNATURES/CONFIDENTIALITY: You and/or your care partner have signed paperwork which will be entered into your electronic medical record.  These signatures attest to the fact that that the information above on your After Visit Summary has been reviewed and is understood.  Full responsibility of the confidentiality of this discharge information lies with you and/or your care-partner.

## 2013-06-14 NOTE — Op Note (Signed)
[facility] [address] [place], [address]   COLONOSCOPY PROCEDURE REPORT  PATIENT: Samuel Meza, Samuel Meza  MR#: [id number] BIRTHDATE: May 07, 1971 , 42  yrs. old GENDER: Male ENDOSCOPIST: Mardella Layman, MD, [facility] REFERRED BY: PROCEDURE DATE:  06/14/2013 PROCEDURE:   Colonoscopy, diagnostic First Screening Colonoscopy - Avg.  risk and is 50 yrs.  old or older - No.  Prior Negative Screening - Now for repeat screening. N/A  History of Adenoma - Now for follow-up colonoscopy & has been > or = to 3 yrs.  N/A ASA CLASS:   Class II INDICATIONS:Rectal Bleeding. MEDICATIONS: propofol (Diprivan) 200mg  IV  DESCRIPTION OF PROCEDURE:   After the risks benefits and alternatives of the procedure were thoroughly explained, informed consent was obtained.  A digital rectal exam revealed no abnormalities of the rectum.   The LB [id number]  endoscope was introduced through the anus and advanced to the cecum, which was identified by both the appendix and ileocecal valve. No adverse events experienced.   The quality of the prep was excellent, using MoviPrep  The instrument was then slowly withdrawn as the colon was fully examined.      COLON FINDINGS: Moderate sized internal hemorrhoids were found.   A normal appearing cecum, ileocecal valve, and appendiceal orifice were identified.  The ascending, hepatic flexure, transverse, splenic flexure, descending, sigmoid colon and rectum appeared unremarkable.  No polyps or cancers were seen.  Retroflexed views revealed internal hemorrhoids. The time to cecum=2 minutes 29 seconds.  Withdrawal time=5 minutes 06 seconds.  The scope was withdrawn and the procedure completed. COMPLICATIONS: There were no complications.  ENDOSCOPIC IMPRESSION: 1.   Moderate sized internal hemorrhoids 2.   Normal colon ..no polyps noted.  RECOMMENDATIONS: Continue current medications Referral for hemorrhoidal banding Endoscopy today   eSigned:   Mardella Layman, MD, [facility] 06/14/2013 1:49 PM   cc:   PATIENT NAME:  Samuel Meza, Samuel Meza MR#: [id number]

## 2013-06-14 NOTE — Telephone Encounter (Signed)
Sister called.  She is not with him but he called her this evening saying he's had black colored stools 3 times since his procedures.  She thinks he otherwise feels fine.  I reviewed his colo, endo reports. Mucosal biospies taken from stomach only.  No polyps removed from colon, no dilations performed.  She doesn't know if he's taken pepto (but has seen some of it at his house).  He only speaks vietnamese.  Procedures both seem pretty low risk for causing any bleeding.  I recommended that if black stools continue, worsen or if he starts feeling othewise unwell he should go to ER.

## 2013-06-15 ENCOUNTER — Telehealth: Payer: Self-pay | Admitting: *Deleted

## 2013-06-15 ENCOUNTER — Telehealth: Payer: Self-pay

## 2013-06-15 ENCOUNTER — Encounter: Payer: Self-pay | Admitting: Gastroenterology

## 2013-06-15 ENCOUNTER — Telehealth: Payer: Self-pay | Admitting: Gastroenterology

## 2013-06-15 ENCOUNTER — Ambulatory Visit (INDEPENDENT_AMBULATORY_CARE_PROVIDER_SITE_OTHER): Payer: BC Managed Care – PPO | Admitting: Family Medicine

## 2013-06-15 ENCOUNTER — Telehealth: Payer: Self-pay | Admitting: Internal Medicine

## 2013-06-15 VITALS — BP 118/78 | HR 70 | Temp 98.5°F | Resp 16 | Ht 63.5 in | Wt 119.8 lb

## 2013-06-15 DIAGNOSIS — R195 Other fecal abnormalities: Secondary | ICD-10-CM

## 2013-06-15 LAB — POCT CBC
MCH, POC: 28.6 pg (ref 27–31.2)
MCV: 92.7 fL (ref 80–97)
MID (cbc): 0.8 (ref 0–0.9)
POC LYMPH PERCENT: 19.5 %L (ref 10–50)
Platelet Count, POC: 267 10*3/uL (ref 142–424)
RBC: 4.12 M/uL — AB (ref 4.69–6.13)
RDW, POC: 13.8 %
WBC: 14.3 10*3/uL — AB (ref 4.6–10.2)

## 2013-06-15 LAB — HELICOBACTER PYLORI SCREEN-BIOPSY: UREASE: NEGATIVE

## 2013-06-15 NOTE — Progress Notes (Signed)
Subjective:    Patient ID: Samuel Meza, male    DOB: 14-Jan-1971, 42 y.o.   MRN: [id number]  HPI   Samuel Meza is a very pleasant 41 yr old male here with concern for dark tarry stools.  He speaks little english - he has a friend helping to translate.  His sister also provided some of the history over the phone.  The pt underwent colonoscopy and endoscopy yesterday.  He has subsequently had 4 episodes of dark tarry stools.  Very concerned that he may be bleeding.  He has been in contact with the GI office today.  Last telephone note indicates pt to go to ED or UC for CBC - if stable will be seen in GI office tomorrow AM.  Last hgb July 2014 is 16.2.  Pt denies abd pain, fever, distension.  No BRBPR.  No syncope or presyncope.   Review of Systems  Constitutional: Negative.   HENT: Negative.   Respiratory: Negative.   Cardiovascular: Negative.   Gastrointestinal: Positive for blood in stool (dark stools). Negative for nausea, vomiting, abdominal pain and abdominal distention.  Musculoskeletal: Negative.   Skin: Negative.   Neurological: Negative.        Objective:   Physical Exam  Vitals reviewed. Constitutional: He is oriented to person, place, and time. He appears well-developed and well-nourished. No distress.  HENT:  Head: Normocephalic and atraumatic.  Eyes: Conjunctivae are normal. No scleral icterus.  Cardiovascular: Normal rate, regular rhythm and normal heart sounds.   Pulmonary/Chest: Effort normal.  Abdominal: Soft.  Neurological: He is alert and oriented to person, place, and time.  Skin: Skin is warm and dry.  Psychiatric: He has a normal mood and affect. His behavior is normal.    Results for orders placed in visit on 06/15/13  POCT CBC      Result Value Range   WBC 14.3 (*) 4.6 - 10.2 K/uL   Lymph, poc 2.8  0.6 - 3.4   POC LYMPH PERCENT 19.5  10 - 50 %L   MID (cbc) 0.8  0 - 0.9   POC MID % 5.4  0 - 12 %M   POC Granulocyte 10.7 (*) 2 - 6.9   Granulocyte percent 75.1   37 - 80 %G   RBC 4.12 (*) 4.69 - 6.13 M/uL   Hemoglobin 11.8 (*) 14.1 - 18.1 g/dL   HCT, POC 29.5 (*) 28.4 - 53.7 %   MCV 92.7  80 - 97 fL   MCH, POC 28.6  27 - 31.2 pg   MCHC 30.9 (*) 31.8 - 35.4 g/dL   RDW, POC 13.2     Platelet Count, POC 267  142 - 424 K/uL   MPV 9.1  0 - 99.8 fL  IFOBT (OCCULT BLOOD)      Result Value Range   IFOBT Positive         Assessment & Plan:  Dark stools - Plan: POCT CBC, IFOBT POC (occult bld, rslt in office)   Samuel Meza is a 42 yr old male who is 1 day s/p colonoscopy and endoscopy, now with 4 episodes of black tarry stools today.  Pt has been in contact with GI with plans to see them tomorrow AM.  CBC today shows hgb of 11 which is a drop from last CBC in July.  Pt also with elevated white count of 14.3.   Hemocult is also positive today.  Hgb is not dangerously low and pt is not experiencing profuse stools  or bleeding.  Vitals are WNL and he is not in distress.  I think he is stable at this point.  Instructed him to follow up with GI first thing tomorrow AM.  Strict ED precautions including more frequent stools, syncope, presyncope.  Encouraged him to remain hydrated tonight.    Case discussed with Dr. Alwyn Ren

## 2013-06-15 NOTE — Progress Notes (Signed)
Reviewed patient with the physician assistant. I spoke briefly with the patient, and he looks fine. Mildly pale. Ambulating well with no dizziness. Stressed the need for him to go to the emergency room if abruptly worse. Plans to see the gastroenterologist tomorrow and get the plate get rechecked. Agree with treatment plan.

## 2013-06-15 NOTE — Telephone Encounter (Signed)
Called and spoke to the patient sisterThuyen Meza.  I made him an appointment per Dr. Jarold Motto for Dr. Leone Payor to do a hemorrhoid banding in the office.  I made it for 9-18 and she asked for a Monday 9-15. I changed the appointment for her. She said that would be better for her brothers schedule.The time is 06-21-2013 at 9:15 am.

## 2013-06-15 NOTE — Telephone Encounter (Signed)
Pt. Brother (his interpreter) called office to advise that brother had one black stool last evening, and one black stool this AM.  Pt. Denies pain. Distention, or fever.  4540 Spoke with Dr. Jarold Motto via phone.  Advised him of patients, black stools x 2.  And that he had no fever, abdominal pain or distention.  Dr. Jarold Motto advised to let pt. Know that this was nothing to be concerned about.  Will call pt. With this message.  9811 Spoke with Glorianne Manchester (brother/interpreter) advise that Dr. Jarold Motto said not to be concerned about the black stools.  Advised Mr. Goyer to call office if other concerns came up.

## 2013-06-15 NOTE — Telephone Encounter (Signed)
No answer, left message

## 2013-06-15 NOTE — Telephone Encounter (Signed)
error 

## 2013-06-15 NOTE — Telephone Encounter (Signed)
1315 Spoke with pt. Sister, Marvyn Torrez.  She is helping with translations.  She states that he has not had any black stools since around 7AM today.  She states he does not feel bad, no pain, abd. Distention or fever.  I advised her that Dr. Norval Gable  Nurse, Graciella Freer would change RX from nexium to something less expensive per pt's request.

## 2013-06-15 NOTE — Patient Instructions (Addendum)
You have blood in your stool today.  Your hemoglobin has dropped since your last labs in July, but it is not dangerously low at this point.  Call your GI doctor first thing tomorrow morning and make sure you see them tomorrow.  If you are having increased stools or feel like your are going to faint (dizzy, lightheaded) then go to the emergency room tonight

## 2013-06-15 NOTE — Telephone Encounter (Addendum)
Pt. Sister Jurell Basista calls to say that pt. Had several black "greasy"looking stools yesterday. And 2 black stools today.  States that he is having no other problems, but family is concerned.    1030 called sister, advised that I'd get back in touch with her around 1:00PM to check on pt.  She also asked if Dr. Jarold Motto would change Nexium to something more affordable.

## 2013-06-15 NOTE — Telephone Encounter (Signed)
Graciuos !!! OTC nexium is ok

## 2013-06-15 NOTE — Telephone Encounter (Signed)
Pt's sister reports pt had a BM yesterday after is ECL and the stool was black. He had another one at 11pm and it was black. This am pt had another stool that was black. Pt had BRB from hemorrhoids before, but nothing black and now pt is afraid he has a slow bleed somewhere. Sister reports the stool is black, tarry and appears oily. Explained that Dr Jarold Motto took at least 2 biopsies from the stomach and duodenum and the black stools could be from old blood from those ares. I will ask Dr Jarold Motto and call her back. Dr Jarold Motto, is it possible pt's black stools could be coming from the biopsies you did on the EGD yesterday? Please advise. Thanks.

## 2013-06-15 NOTE — Telephone Encounter (Signed)
Sister called regarding brother who had another black tarry stool this evening. This is 4 since his EGD/colon yesterday.  On review of procedures, the only diagnostic intervention was gastric BX, which should be very very low risk for bleeding.  Polypectomy was not performed. Pt feels well, but is anxious about the black stools. No abd pain, weakness, fatigue, pain, dyspnea  I advised ED or urgent care visit for CBC tonight.  If stable, then he can be seen in the office in the morning Jarold Motto or PA)  Will alert Dr. Jarold Motto to the phone call.

## 2013-06-16 ENCOUNTER — Ambulatory Visit (INDEPENDENT_AMBULATORY_CARE_PROVIDER_SITE_OTHER): Payer: BC Managed Care – PPO | Admitting: Gastroenterology

## 2013-06-16 ENCOUNTER — Telehealth: Payer: Self-pay | Admitting: *Deleted

## 2013-06-16 ENCOUNTER — Encounter (HOSPITAL_COMMUNITY): Payer: Self-pay | Admitting: Gastroenterology

## 2013-06-16 ENCOUNTER — Ambulatory Visit (HOSPITAL_COMMUNITY)
Admission: RE | Admit: 2013-06-16 | Discharge: 2013-06-16 | Disposition: A | Discharge status: Home / Self Care | Payer: BC Managed Care – PPO | Source: Ambulatory Visit | Attending: Gastroenterology | Admitting: Gastroenterology

## 2013-06-16 ENCOUNTER — Encounter: Payer: Self-pay | Admitting: Gastroenterology

## 2013-06-16 ENCOUNTER — Encounter (HOSPITAL_COMMUNITY)
Admission: RE | Disposition: A | Discharge status: Home / Self Care | Payer: Self-pay | Source: Ambulatory Visit | Attending: Gastroenterology

## 2013-06-16 ENCOUNTER — Encounter: Payer: BC Managed Care – PPO | Admitting: Gastroenterology

## 2013-06-16 VITALS — BP 118/60 | HR 76 | Ht 63.0 in | Wt 118.0 lb

## 2013-06-16 DIAGNOSIS — K922 Gastrointestinal hemorrhage, unspecified: Secondary | ICD-10-CM

## 2013-06-16 DIAGNOSIS — Y849 Medical procedure, unspecified as the cause of abnormal reaction of the patient, or of later complication, without mention of misadventure at the time of the procedure: Secondary | ICD-10-CM | POA: Insufficient documentation

## 2013-06-16 DIAGNOSIS — K921 Melena: Secondary | ICD-10-CM

## 2013-06-16 DIAGNOSIS — IMO0002 Reserved for concepts with insufficient information to code with codable children: Secondary | ICD-10-CM | POA: Insufficient documentation

## 2013-06-16 HISTORY — PX: ESOPHAGOGASTRODUODENOSCOPY: SHX5428

## 2013-06-16 SURGERY — EGD (ESOPHAGOGASTRODUODENOSCOPY)
Anesthesia: Moderate Sedation

## 2013-06-16 MED ORDER — MIDAZOLAM HCL 5 MG/ML IJ SOLN
INTRAMUSCULAR | Status: AC
  Filled 2013-06-16: qty 2

## 2013-06-16 MED ORDER — SODIUM CHLORIDE 0.9 % IV SOLN
INTRAVENOUS | Status: DC
  Administered 2013-06-16: 14:00:00 via INTRAVENOUS

## 2013-06-16 MED ORDER — FENTANYL CITRATE 0.05 MG/ML IJ SOLN
INTRAMUSCULAR | Status: DC | PRN
  Administered 2013-06-16 (×3): 25 ug via INTRAVENOUS

## 2013-06-16 MED ORDER — BUTAMBEN-TETRACAINE-BENZOCAINE 2-2-14 % EX AERO
INHALATION_SPRAY | CUTANEOUS | Status: DC | PRN
  Administered 2013-06-16: 2 via TOPICAL

## 2013-06-16 MED ORDER — MIDAZOLAM HCL 10 MG/2ML IJ SOLN
INTRAMUSCULAR | Status: DC | PRN
  Administered 2013-06-16 (×3): 2 mg via INTRAVENOUS

## 2013-06-16 MED ORDER — FENTANYL CITRATE 0.05 MG/ML IJ SOLN
INTRAMUSCULAR | Status: AC
  Filled 2013-06-16: qty 2

## 2013-06-16 NOTE — Patient Instructions (Addendum)
You have been scheduled for an endoscopy today at [facility] at 245 pm.   Please follow written instructions given to you at your visit today. If you use inhalers (even only as needed), please bring them with you on the day of your procedure. Your physician has requested that you go to www.startemmi.com and enter the access code given to you at your visit today. This web site gives a general overview about your procedure. However, you should still follow specific instructions given to you by our office regarding your preparation for the procedure. CC:  Sanda Linger MD

## 2013-06-16 NOTE — H&P (View-Only) (Signed)
06/16/2013 Samuel Meza [id number] 04/04/1971   History of Present Illness:  Patient is a 42 year old male who is known to Dr. Patterson and had an EGD and colonoscopy by him on Monday 9/8.  Colonoscopy was normal except for moderate sized internal hemorrhoids.  EGD showed a hiatal hernia and GERD.  Stomach and duodenum were normal but gastric and duodenal biopsies were performed to rule out Hpylori.  Since yesterday he has been having melena approximately 5-6 times.  He went to an urgent care yesterday and Hgb was 11.8 grams, which is down from 16.2 grams in July.  He was strongly heme positive yesterday and today at his visit as well.    Current Medications, Allergies, Past Medical History, Past Surgical History, Family History and Social History were reviewed in [facility] electronic medical record.   Physical Exam: BP 118/60  Pulse 76  Ht 5' 3" (1.6 m)  Wt 118 lb (53.524 kg)  BMI 20.91 kg/m2 General: Well developed Asian male in no acute distress Head: Normocephalic and atraumatic Eyes:  sclerae anicteric, conjunctiva pink  Ears: Normal auditory acuity Lungs: Clear throughout to auscultation Heart: Regular rate and rhythm Abdomen: Soft, non-distended.  BS present.  Non-tender.   Rectal:  Dark green colored stool that was strongly heme positive. Musculoskeletal: Symmetrical with no gross deformities.  Extremities: No edema  Neurological: Alert oriented x 4, grossly nonfocal Psychological:  Alert and cooperative. Normal mood and affect  Assessment and Recommendations: -Melena, multiple episodes since yesterday:  Post EGD with biopsies on 9/8.  Hgb down 4.5 grams since July.  Strongly heme positive.  Rule out bleeding at biopsy site.  Repeat EGD with Dr. Jacobs at [facility] later day. 

## 2013-06-16 NOTE — Progress Notes (Signed)
06/16/2013 Samuel Meza [id number] 10/05/1971   History of Present Illness:  Patient is a 42 year old male who is known to Dr. Jarold Motto and had an EGD and colonoscopy by him on Monday 9/8.  Colonoscopy was normal except for moderate sized internal hemorrhoids.  EGD showed a hiatal hernia and GERD.  Stomach and duodenum were normal but gastric and duodenal biopsies were performed to rule out Hpylori.  Since yesterday he has been having melena approximately 5-6 times.  He went to an urgent care yesterday and Hgb was 11.8 grams, which is down from 16.2 grams in July.  He was strongly heme positive yesterday and today at his visit as well.    Current Medications, Allergies, Past Medical History, Past Surgical History, Family History and Social History were reviewed in [employer] record.   Physical Exam: BP 118/60  Pulse 76  Ht 5\' 3"  (1.6 m)  Wt 118 lb (53.524 kg)  BMI 20.91 kg/m2 General: Well developed Asian male in no acute distress Head: Normocephalic and atraumatic Eyes:  sclerae anicteric, conjunctiva pink  Ears: Normal auditory acuity Lungs: Clear throughout to auscultation Heart: Regular rate and rhythm Abdomen: Soft, non-distended.  BS present.  Non-tender.   Rectal:  Dark green colored stool that was strongly heme positive. Musculoskeletal: Symmetrical with no gross deformities.  Extremities: No edema  Neurological: Alert oriented x 4, grossly nonfocal Psychological:  Alert and cooperative. Normal mood and affect  Assessment and Recommendations: -Melena, multiple episodes since yesterday:  Post EGD with biopsies on 9/8.  Hgb down 4.5 grams since July.  Strongly heme positive.  Rule out bleeding at biopsy site.  Repeat EGD with Dr. Christella Hartigan at [facility] later day.

## 2013-06-16 NOTE — Op Note (Signed)
[facility] [address] [place], [address]   ENDOSCOPY PROCEDURE REPORT  PATIENT: Samuel Meza, Samuel Meza  MR#: [id number] BIRTHDATE: 1970-10-24 , 42  yrs. old GENDER: Male ENDOSCOPIST: Rachael Fee, MD REFERRED BY:  Mardella Layman, M.D. PROCEDURE DATE:  06/16/2013 PROCEDURE:  EGD w/ control of bleeding ASA CLASS:     Class II INDICATIONS:  melena, drop in Hb since EGD/colonoscopy several days ago; mucosal biopsies were taken from stomach, duodenum. MEDICATIONS: Fentanyl 75 mcg IV and Versed 5 mg IV TOPICAL ANESTHETIC: Cetacaine Spray  DESCRIPTION OF PROCEDURE: After the risks benefits and alternatives of the procedure were thoroughly explained, informed consent was obtained.  The Pentax Gastroscope [id number] endoscope was introduced through the mouth and advanced to the second portion of the duodenum. Without limitations.  The instrument was slowly withdrawn as the mucosa was fully examined.    In second duodenum, along the wall opposite the major papilla there was bright red blood clot.  This was suctioned away and underlying was an actively oozing small visible vessel without associated ulceration, inflammation.  This was treated with endoclip application (took 3 clips to obtain proper position, complete hemostasis).  There was also small site in distal stomach that was a normally healing, non-bleeding site from recent gastric mucosal biospy.  The examination was otherwise normal.  Retroflexed views revealed no abnormalities.     The scope was then withdrawn from the patient and the procedure completed.  COMPLICATIONS: There were no complications. ENDOSCOPIC IMPRESSION: Active oozing from small visible vessel in second duodenum, presumed site of recent mucosal biopsy.  Treated with endoclip placement. This is at low risk for further rebleeding.  RECOMMENDATIONS: OK to d/c from endoscopy.  You may have another 1-2 dark stools as the old blood is  finally expelled.   eSigned:  Rachael Fee, MD 06/16/2013 3:16 PM

## 2013-06-16 NOTE — Interval H&P Note (Signed)
History and Physical Interval Note:  06/16/2013 2:35 PM  Samuel Meza  has presented today for surgery, with the diagnosis of melena  The various methods of treatment have been discussed with the patient and family. After consideration of risks, benefits and other options for treatment, the patient has consented to  Procedure(s): ESOPHAGOGASTRODUODENOSCOPY (EGD) (N/A) as a surgical intervention .  The patient's history has been reviewed, patient examined, no change in status, stable for surgery.  I have reviewed the patient's chart and labs.  Questions were answered to the patient's satisfaction.     Rob Bunting

## 2013-06-16 NOTE — Telephone Encounter (Signed)
Beverley Fiedler, MD at 06/15/2013 7:38 PM    Status: Signed             Sister called regarding brother who had another black tarry stool this evening.  This is 4 since his EGD/colon yesterday. On review of procedures, the only diagnostic intervention was gastric BX, which should be very very low risk for bleeding. Polypectomy was not performed.  Pt feels well, but is anxious about the black stools.  No abd pain, weakness, fatigue, pain, dyspnea  I advised ED or urgent care visit for CBC tonight. If stable, then he can be seen in the office in the morning Samuel Meza or PA)  Will alert Dr. Jarold Meza to the phone call.    Called pt's sister this am and they did take the pt to an UC and his HGB had dropped to 11.1. Last HGB was 1 month ago and pt did have bleeding, BRB, hemorrhoids in the interim. Sister reports pt had another black stool since they spoke with Dr Rhea Belton last night. Per Dr Samuel Meza, pt will see Samuel Sou, PA this am. Sister stated understanding.

## 2013-06-17 ENCOUNTER — Encounter (HOSPITAL_COMMUNITY): Payer: Self-pay | Admitting: Gastroenterology

## 2013-06-17 NOTE — Telephone Encounter (Signed)
Opened in error

## 2013-06-18 ENCOUNTER — Encounter: Payer: Self-pay | Admitting: Gastroenterology

## 2013-06-21 ENCOUNTER — Encounter: Payer: BC Managed Care – PPO | Admitting: Internal Medicine

## 2013-06-24 ENCOUNTER — Encounter: Payer: BC Managed Care – PPO | Admitting: Internal Medicine

## 2013-06-28 ENCOUNTER — Ambulatory Visit: Payer: BC Managed Care – PPO | Admitting: Internal Medicine

## 2013-06-29 ENCOUNTER — Ambulatory Visit (INDEPENDENT_AMBULATORY_CARE_PROVIDER_SITE_OTHER): Payer: BC Managed Care – PPO | Admitting: Internal Medicine

## 2013-06-29 ENCOUNTER — Encounter: Payer: Self-pay | Admitting: Internal Medicine

## 2013-06-29 VITALS — BP 102/62 | HR 59 | Temp 97.4°F | Ht 64.0 in | Wt 123.0 lb

## 2013-06-29 DIAGNOSIS — R7302 Impaired glucose tolerance (oral): Secondary | ICD-10-CM

## 2013-06-29 DIAGNOSIS — K219 Gastro-esophageal reflux disease without esophagitis: Secondary | ICD-10-CM

## 2013-06-29 DIAGNOSIS — Z0000 Encounter for general adult medical examination without abnormal findings: Secondary | ICD-10-CM

## 2013-06-29 DIAGNOSIS — M79643 Pain in unspecified hand: Secondary | ICD-10-CM | POA: Insufficient documentation

## 2013-06-29 DIAGNOSIS — R7309 Other abnormal glucose: Secondary | ICD-10-CM

## 2013-06-29 DIAGNOSIS — E785 Hyperlipidemia, unspecified: Secondary | ICD-10-CM

## 2013-06-29 DIAGNOSIS — M79609 Pain in unspecified limb: Secondary | ICD-10-CM

## 2013-06-29 DIAGNOSIS — M109 Gout, unspecified: Secondary | ICD-10-CM

## 2013-06-29 MED ORDER — SIMVASTATIN 20 MG PO TABS: ORAL_TABLET | ORAL | Status: DC

## 2013-06-29 MED ORDER — LEVOTHYROXINE SODIUM 75 MCG PO TABS: 75.0000 ug | ORAL_TABLET | Freq: Every day | ORAL | Status: DC

## 2013-06-29 MED ORDER — ALLOPURINOL 300 MG PO TABS: 300.0000 mg | ORAL_TABLET | Freq: Every day | ORAL | Status: DC

## 2013-06-29 MED ORDER — ESOMEPRAZOLE MAGNESIUM 40 MG PO CPDR: 40.0000 mg | DELAYED_RELEASE_CAPSULE | Freq: Every day | ORAL | Status: DC

## 2013-06-29 NOTE — Assessment & Plan Note (Signed)
Nonspecific, likely related to overuse, for tylenol prn

## 2013-06-29 NOTE — Assessment & Plan Note (Signed)
Asympt, for allopurinol to continue, d/c colchicine as not taking anyway, for uric acid level next visit

## 2013-06-29 NOTE — Patient Instructions (Signed)
OK to take Tylenol for the hand and finger pain, as well as the back pain Please do not take the advil or alleve OTC due to the recent stomach bleeding Please continue all other medications as before, and refills have been done if requested - the 90 day prescriptions to [facility] Please have the pharmacy call with any other refills you may need. Please keep your appointments with your specialists as you have planned  - the banding with GI Please remember to sign up for My Chart if you have not done so, as this will be important to you in the future with finding out test results, communicating by private email, and scheduling acute appointments online when needed.  Please return in 6 months, or sooner if needed, with Lab testing done 3-5 days before

## 2013-06-29 NOTE — Progress Notes (Signed)
  Subjective:    Patient ID: Samuel Meza, male    DOB: [birth year], 42 y.o.   MRN: [id number]  HPI  Here with brother who acts as interpretor; pt works at [employer] with a lot of finger use, has intermittent pain to finger, joint aching without swelling and hard to open jars.  Had recent GI bleeding s/p EGD and colonoscopy with UGI oozing and hemorrhoid, due to f/u with banding soon.  No further overt bleeding.  Mentions that he is not taking the colchicine b/c does not want 2 meds for gout. Overall good compliance with treatment, and good medicine tolerability.  Also has recurring mid upper back pain after working bending forward at work, better to lie down. Past Medical History  Diagnosis Date  . Hyperlipidemia   . Thyroid disease   . Gout   . Diabetes mellitus without complication    Past Surgical History  Procedure Laterality Date  . Esophagogastroduodenoscopy N/A 06/16/2013    Procedure: ESOPHAGOGASTRODUODENOSCOPY (EGD);  Surgeon: Rachael Fee, MD;  Location: [facility] ENDOSCOPY;  Service: Endoscopy;  Laterality: N/A;    reports that he has never smoked. He has never used smokeless tobacco. He reports that  drinks alcohol. He reports that he does not use illicit drugs. family history includes Hyperlipidemia in his father, mother, and other; Hypertension in his father, mother, and other. There is no history of Alcohol abuse, Cancer, Diabetes, Early death, Heart disease, Kidney disease, or Stroke. No Known Allergies Current Outpatient Prescriptions on File Prior to Visit  Medication Sig Dispense Refill  . pramoxine-hydrocortisone (ANALPRAM HC) cream Apply topically at bedtime.  30 g  1   No current facility-administered medications on file prior to visit.   Review of Systems  Constitutional: Negative for unexpected weight change, or unusual diaphoresis  HENT: Negative for tinnitus.   Eyes: Negative for photophobia and visual disturbance.  Respiratory: Negative for choking and stridor.    Gastrointestinal: Negative for vomiting and blood in stool.  Genitourinary: Negative for hematuria and decreased urine volume.  Musculoskeletal: Negative for acute joint swelling Skin: Negative for color change and wound.  Neurological: Negative for tremors and numbness other than noted  Psychiatric/Behavioral: Negative for decreased concentration or  hyperactivity.       Objective:   Physical Exam BP 102/62  Pulse 59  Temp(Src) 97.4 F (36.3 C) (Oral)  Ht 5\' 4"  (1.626 m)  Wt 123 lb (55.792 kg)  BMI 21.1 kg/m2  SpO2 98% VS noted,  Constitutional: Pt appears well-developed and well-nourished.  HENT: Head: NCAT.  Right Ear: External ear normal.  Left Ear: External ear normal.  Eyes: Conjunctivae and EOM are normal. Pupils are equal, round, and reactive to light.  Neck: Normal range of motion. Neck supple.  Cardiovascular: Normal rate and regular rhythm.   Pulmonary/Chest: Effort normal and breath sounds normal.  Abd:  Soft, NT, non-distended, + BS Neurological: Pt is alert. Not confused  Skin: Skin is warm. No erythema.  Psychiatric: Pt behavior is normal. Thought content normal. mild nervous No active synovitis       Assessment & Plan:

## 2013-06-29 NOTE — Assessment & Plan Note (Signed)
Asympt, for a1c next visit Lab Results  Component Value Date   HGBA1C 5.8 04/22/2013

## 2013-06-29 NOTE — Assessment & Plan Note (Signed)
stable overall by history and exam, recent data reviewed with pt, and pt to continue medical treatment as before,  to f/u any worsening symptoms or concerns Lab Results  Component Value Date   LDLCALC 76 08/03/2012

## 2013-07-20 ENCOUNTER — Encounter: Payer: Self-pay | Admitting: Internal Medicine

## 2013-07-20 ENCOUNTER — Ambulatory Visit (INDEPENDENT_AMBULATORY_CARE_PROVIDER_SITE_OTHER): Payer: BC Managed Care – PPO | Admitting: Internal Medicine

## 2013-07-20 VITALS — BP 86/58 | HR 76 | Ht 63.0 in | Wt 120.4 lb

## 2013-07-20 DIAGNOSIS — K648 Other hemorrhoids: Secondary | ICD-10-CM

## 2013-07-20 HISTORY — DX: Other hemorrhoids: K64.8

## 2013-07-20 NOTE — Assessment & Plan Note (Addendum)
RP pile ligated - RTC 2 weeks approximately to consider additional banding

## 2013-07-20 NOTE — Patient Instructions (Addendum)
HEMORRHOID BANDING PROCEDURE    FOLLOW-UP CARE   1. The procedure you have had should have been relatively painless since the banding of the area involved does not have nerve endings and there is no pain sensation.  The rubber band cuts off the blood supply to the hemorrhoid and the band may fall off as soon as 48 hours after the banding (the band may occasionally be seen in the toilet bowl following a bowel movement). You may notice a temporary feeling of fullness in the rectum which should respond adequately to plain Tylenol or Motrin.  2. Following the banding, avoid strenuous exercise that evening and resume full activity the next day.  A sitz bath (soaking in a warm tub) or bidet is soothing, and can be useful for cleansing the area after bowel movements.     3. To avoid constipation, take two tablespoons of natural wheat bran, natural oat bran, flax, Benefiber or any over the counter fiber supplement and increase your water intake to 7-8 glasses daily.    4. Unless you have been prescribed anorectal medication, do not put anything inside your rectum for two weeks: No suppositories, enemas, fingers, etc.  5. Occasionally, you may have more bleeding than usual after the banding procedure.  This is often from the untreated hemorrhoids rather than the treated one.  Don't be concerned if there is a tablespoon or so of blood.  If there is more blood than this, lie flat with your bottom higher than your head and apply an ice pack to the area. If the bleeding does not stop within a half an hour or if you feel faint, call our office at [phone] or go to the emergency room.  6. Problems are not common; however, if there is a substantial amount of bleeding, severe pain, chills, fever or difficulty passing urine (very rare) or other problems, you should call us at [phone] or report to the nearest emergency room.  7. Do not stay seated continuously for more than 2-3 hours for a day or two  after the procedure.  Tighten your buttock muscles 10-15 times every two hours and take 10-15 deep breaths every 1-2 hours.  Do not spend more than a few minutes on the toilet if you cannot empty your bowel; instead re-visit the toilet at a later time.    I appreciate the opportunity to care for you.  Follow up with [place] in 2 weeks for your next banding.

## 2013-07-21 ENCOUNTER — Encounter: Payer: Self-pay | Admitting: Internal Medicine

## 2013-07-21 NOTE — Progress Notes (Signed)
Patient ID: Samuel Meza, male   DOB: [birth year], 42 y.o.   MRN: [id number]   PROCEDURE NOTE: The patient presents with symptomatic grade 1  hemorrhoids, requesting rubber band ligation of his/her hemorrhoidal disease.  All risks, benefits and alternative forms of therapy were described and informed consent was obtained.   The decision was made to band the RP internal hemorrhoid, and the [facility] O'Regan System was used to perform band ligation without complication.  Digital anorectal examination was then performed to assure proper positioning of the band, and to adjust the banded tissue as required.  The patient was discharged home without pain or other issues.  Dietary and behavioral recommendations were given and along with follow-up instructions.     The following adjunctive treatments were recommended:  The patient will return in about 2 weeks for  follow-up and possible additional banding as required. No complications were encountered and the patient tolerated the procedure well.

## 2013-08-09 ENCOUNTER — Encounter: Payer: Self-pay | Admitting: Internal Medicine

## 2013-08-09 ENCOUNTER — Ambulatory Visit (INDEPENDENT_AMBULATORY_CARE_PROVIDER_SITE_OTHER): Payer: BC Managed Care – PPO | Admitting: Internal Medicine

## 2013-08-09 VITALS — BP 94/64 | HR 60 | Ht 63.5 in | Wt 121.0 lb

## 2013-08-09 DIAGNOSIS — K648 Other hemorrhoids: Secondary | ICD-10-CM

## 2013-08-09 NOTE — Progress Notes (Signed)
Patient ID: Samuel Meza, male   DOB: 08-04-1971, 42 y.o.   MRN: [id number]  PROCEDURE NOTE: The patient presents with symptomatic grade 1  Hemorrhoids, (bleeding) requesting rubber band ligation of his/her hemorrhoidal disease.  All risks, benefits and alternative forms of therapy were described and informed consent was obtained.   The anorectum was pre-medicated with 0.125% NTG and 5% lidocaine The decision was made to band the LL and RA  internal hemorrhoids, and the [facility] O'Regan System was used to perform band ligation without complication.  Digital anorectal examination was then performed to assure proper positioning of the band, and to adjust the banded tissue as required.  The patient was discharged home without pain or other issues.  Dietary and behavioral recommendations were given and along with follow-up instructions.     The following adjunctive treatments were recommended:  Benefiber  The patient will return in 2 months for  follow-up and possible additional banding as required. No complications were encountered and the patient tolerated the procedure well.  I appreciate the opportunity to care for this patient.

## 2013-08-09 NOTE — Patient Instructions (Signed)
HEMORRHOID BANDING PROCEDURE    FOLLOW-UP CARE   1. The procedure you have had should have been relatively painless since the banding of the area involved does not have nerve endings and there is no pain sensation.  The rubber band cuts off the blood supply to the hemorrhoid and the band may fall off as soon as 48 hours after the banding (the band may occasionally be seen in the toilet bowl following a bowel movement). You may notice a temporary feeling of fullness in the rectum which should respond adequately to plain Tylenol or Motrin.  2. Following the banding, avoid strenuous exercise that evening and resume full activity the next day.  A sitz bath (soaking in a warm tub) or bidet is soothing, and can be useful for cleansing the area after bowel movements.     3. To avoid constipation, take two tablespoons of natural wheat bran, natural oat bran, flax, Benefiber or any over the counter fiber supplement and increase your water intake to 7-8 glasses daily.    4. Unless you have been prescribed anorectal medication, do not put anything inside your rectum for two weeks: No suppositories, enemas, fingers, etc.  5. Occasionally, you may have more bleeding than usual after the banding procedure.  This is often from the untreated hemorrhoids rather than the treated one.  Don't be concerned if there is a tablespoon or so of blood.  If there is more blood than this, lie flat with your bottom higher than your head and apply an ice pack to the area. If the bleeding does not stop within a half an hour or if you feel faint, call our office at [phone] or go to the emergency room.  6. Problems are not common; however, if there is a substantial amount of bleeding, severe pain, chills, fever or difficulty passing urine (very rare) or other problems, you should call us at [phone] or report to the nearest emergency room.  7. Do not stay seated continuously for more than 2-3 hours for a day or two  after the procedure.  Tighten your buttock muscles 10-15 times every two hours and take 10-15 deep breaths every 1-2 hours.  Do not spend more than a few minutes on the toilet if you cannot empty your bowel; instead re-visit the toilet at a later time.    Follow up in 2 months.  I appreciate the opportunity to care for you.

## 2013-08-09 NOTE — Assessment & Plan Note (Signed)
LL and RA ligated RTC 2 months

## 2013-08-18 ENCOUNTER — Encounter: Payer: Self-pay | Admitting: Internal Medicine

## 2013-08-18 ENCOUNTER — Ambulatory Visit (INDEPENDENT_AMBULATORY_CARE_PROVIDER_SITE_OTHER)
Admission: RE | Admit: 2013-08-18 | Discharge: 2013-08-18 | Disposition: A | Discharge status: Home / Self Care | Payer: BC Managed Care – PPO | Source: Ambulatory Visit | Attending: Internal Medicine | Admitting: Internal Medicine

## 2013-08-18 ENCOUNTER — Ambulatory Visit (INDEPENDENT_AMBULATORY_CARE_PROVIDER_SITE_OTHER): Payer: BC Managed Care – PPO | Admitting: Internal Medicine

## 2013-08-18 ENCOUNTER — Other Ambulatory Visit (INDEPENDENT_AMBULATORY_CARE_PROVIDER_SITE_OTHER): Payer: BC Managed Care – PPO

## 2013-08-18 VITALS — BP 94/68 | HR 72 | Temp 98.0°F | Wt 120.0 lb

## 2013-08-18 DIAGNOSIS — R042 Hemoptysis: Secondary | ICD-10-CM

## 2013-08-18 DIAGNOSIS — R7309 Other abnormal glucose: Secondary | ICD-10-CM

## 2013-08-18 DIAGNOSIS — M5412 Radiculopathy, cervical region: Secondary | ICD-10-CM | POA: Insufficient documentation

## 2013-08-18 LAB — BASIC METABOLIC PANEL
CO2: 31 mEq/L (ref 19–32)
Calcium: 9.4 mg/dL (ref 8.4–10.5)
Chloride: 100 mEq/L (ref 96–112)
Creatinine, Ser: 0.9 mg/dL (ref 0.4–1.5)
Glucose, Bld: 105 mg/dL — ABNORMAL HIGH (ref 70–99)

## 2013-08-18 MED ORDER — TRAMADOL HCL 50 MG PO TABS: 50.0000 mg | ORAL_TABLET | Freq: Four times a day (QID) | ORAL | Status: DC | PRN

## 2013-08-18 MED ORDER — PREDNISONE 10 MG PO TABS: ORAL_TABLET | ORAL | Status: DC

## 2013-08-18 NOTE — Assessment & Plan Note (Signed)
Pt requests a1c

## 2013-08-18 NOTE — Progress Notes (Signed)
Pre-visit discussion using our clinic review tool. No additional management support is needed unless otherwise documented below in the visit note.  

## 2013-08-18 NOTE — Assessment & Plan Note (Addendum)
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

## 2013-08-18 NOTE — Assessment & Plan Note (Addendum)
Vs sinus related, Recent severe gag and cough transient for unclear reason, afeb, cureently asymptom but with a rather significant blood clot produced - for cxr

## 2013-08-18 NOTE — Patient Instructions (Signed)
Please take all new medication as prescribed - the pain medication, and the prednisone Please continue all other medications as before  Please go to the XRAY Department in the Basement (go straight as you get off the elevator) for the x-ray testing Please go to the LAB in the Basement (turn left off the elevator) for the tests to be done today You will be contacted by phone if any changes need to be made immediately.  Otherwise, you will receive a letter about your results with an explanation, but please check with MyChart first.  Please remember to sign up for My Chart if you have not done so, as this will be important to you in the future with finding out test results, communicating by private email, and scheduling acute appointments online when needed.  Please call in 1 wk if not improved, to consider orthopedic or MRI referral

## 2013-08-18 NOTE — Progress Notes (Signed)
Subjective:    Patient ID: Samuel Meza, male    DOB: 1971-01-10, 42 y.o.   MRN: [id number]  HPI  Here with brother as interpretor, c/o 1 mo onset gradually worsening lower neck pain assoc with bilat UE pain, numb and distal RUE intermittent weakness though none now;  Right handed.  Has had some decreased grip so that he cannot open water bottles.  Still working in the nail salon, no hx of known c-spine dz or CTS.  Also incidentally mentions a strong cough episode yesterday am of "deep" congestion , coughing up a small glob of BRB/clot, shows me a picture on his cell phone.   Pt denies fever, wt loss, night sweats, loss of appetite, or other constitutional symptomsl  Pt denies chest pain, increased sob or doe, wheezing, orthopnea, PND, increased LE swelling, palpitations, dizziness or syncope. Often has some am congestion but after initial cough, does ok rest of the day. Past Medical History  Diagnosis Date  . Hyperlipidemia   . Thyroid disease   . Gout   . Diabetes mellitus without complication   . Hemorrhoids, internal, with bleeding 07/20/2013   Past Surgical History  Procedure Laterality Date  . Esophagogastroduodenoscopy N/A 06/16/2013    Procedure: ESOPHAGOGASTRODUODENOSCOPY (EGD);  Surgeon: Rachael Fee, MD;  Location: [facility] ENDOSCOPY;  Service: Endoscopy;  Laterality: N/A;  . Colonoscopy  2014  . Hemorrhoid banding  2014    reports that he has never smoked. He has never used smokeless tobacco. He reports that he drinks alcohol. He reports that he does not use illicit drugs. family history includes Hyperlipidemia in his father, mother, and other; Hypertension in his father, mother, and other. There is no history of Alcohol abuse, Cancer, Diabetes, Early death, Heart disease, Kidney disease, or Stroke. No Known Allergies Current Outpatient Prescriptions on File Prior to Visit  Medication Sig Dispense Refill  . allopurinol (ZYLOPRIM) 300 MG tablet Take 1 tablet (300 mg total) by mouth daily.   90 tablet  3  . esomeprazole (NEXIUM) 40 MG capsule Take 1 capsule (40 mg total) by mouth daily before breakfast.  90 capsule  3  . levothyroxine (SYNTHROID, LEVOTHROID) 75 MCG tablet Take 1 tablet (75 mcg total) by mouth daily.  90 tablet  3  . simvastatin (ZOCOR) 20 MG tablet TAKE ONE TABLET BY MOUTH IN THE EVENING  90 tablet  3   No current facility-administered medications on file prior to visit.   Review of Systems . Constitutional: Negative for unexpected weight change, or unusual diaphoresis  HENT: Negative for tinnitus.   Eyes: Negative for photophobia and visual disturbance.  Respiratory: Negative for choking and stridor.   Gastrointestinal: Negative for vomiting and blood in stool.  Genitourinary: Negative for hematuria and decreased urine volume.  Musculoskeletal: Negative for acute joint swelling Skin: Negative for color change and wound.  Neurological: Negative for tremors and numbness other than noted  Psychiatric/Behavioral: Negative for decreased concentration or  hyperactivity.       Objective:   Physical Exam BP 94/68  Pulse 72  Temp(Src) 98 F (36.7 C) (Oral)  Wt 120 lb (54.432 kg)  SpO2 97% VS noted,  Constitutional: Pt appears well-developed and well-nourished.  HENT: Head: NCAT.  Right Ear: External ear normal.  Left Ear: External ear normal.  Eyes: Conjunctivae and EOM are normal. Pupils are equal, round, and reactive to light.  Neck: Normal range of motion. Neck supple.  Cardiovascular: Normal rate and regular rhythm.   Pulmonary/Chest: Effort normal  and breath sounds normal.  Abd:  Soft, NT, non-distended, + BS Neurological: Pt is alert. Not confused , motor 5/5, sens/dtr UE's intact, gait intact Skin: Skin is warm. No erythema.  Psychiatric: Pt behavior is normal. Thought content normal.     Assessment & Plan:

## 2013-08-23 ENCOUNTER — Telehealth: Payer: Self-pay | Admitting: *Deleted

## 2013-08-23 NOTE — Telephone Encounter (Signed)
All labs/xray normal.  I do not how to deactivate his MyChart accnt

## 2013-08-23 NOTE — Telephone Encounter (Signed)
Pt called requesting lab results and to deactivate MyChart due to pt not having internet.  I returned call, left message for tp to retrun call to [facility],

## 2013-08-24 NOTE — Telephone Encounter (Signed)
Advised of MDs result note, My Chart deactivated

## 2013-08-26 ENCOUNTER — Emergency Department (HOSPITAL_COMMUNITY)
Admission: EM | Admit: 2013-08-26 | Discharge: 2013-08-26 | Disposition: A | Discharge status: Home / Self Care | Payer: BC Managed Care – PPO | Attending: Emergency Medicine | Admitting: Emergency Medicine

## 2013-08-26 ENCOUNTER — Encounter (HOSPITAL_COMMUNITY): Payer: Self-pay | Admitting: Emergency Medicine

## 2013-08-26 ENCOUNTER — Telehealth: Payer: Self-pay | Admitting: Internal Medicine

## 2013-08-26 DIAGNOSIS — E785 Hyperlipidemia, unspecified: Secondary | ICD-10-CM | POA: Insufficient documentation

## 2013-08-26 DIAGNOSIS — R5381 Other malaise: Secondary | ICD-10-CM | POA: Insufficient documentation

## 2013-08-26 DIAGNOSIS — Z8679 Personal history of other diseases of the circulatory system: Secondary | ICD-10-CM | POA: Insufficient documentation

## 2013-08-26 DIAGNOSIS — M109 Gout, unspecified: Secondary | ICD-10-CM | POA: Insufficient documentation

## 2013-08-26 DIAGNOSIS — E119 Type 2 diabetes mellitus without complications: Secondary | ICD-10-CM | POA: Insufficient documentation

## 2013-08-26 DIAGNOSIS — K625 Hemorrhage of anus and rectum: Secondary | ICD-10-CM | POA: Insufficient documentation

## 2013-08-26 DIAGNOSIS — IMO0002 Reserved for concepts with insufficient information to code with codable children: Secondary | ICD-10-CM | POA: Insufficient documentation

## 2013-08-26 DIAGNOSIS — E079 Disorder of thyroid, unspecified: Secondary | ICD-10-CM | POA: Insufficient documentation

## 2013-08-26 DIAGNOSIS — Z79899 Other long term (current) drug therapy: Secondary | ICD-10-CM | POA: Insufficient documentation

## 2013-08-26 DIAGNOSIS — Z9889 Other specified postprocedural states: Secondary | ICD-10-CM | POA: Insufficient documentation

## 2013-08-26 LAB — COMPREHENSIVE METABOLIC PANEL
AST: 18 U/L (ref 0–37)
BUN: 16 mg/dL (ref 6–23)
CO2: 26 mEq/L (ref 19–32)
Calcium: 9.3 mg/dL (ref 8.4–10.5)
Chloride: 102 mEq/L (ref 96–112)
Creatinine, Ser: 0.94 mg/dL (ref 0.50–1.35)
GFR calc Af Amer: >90 mL/min (ref >90)
GFR calc non Af Amer: >90 mL/min (ref >90)
Glucose, Bld: 123 mg/dL — ABNORMAL HIGH (ref 70–99)
Total Bilirubin: 0.3 mg/dL (ref 0.3–1.2)

## 2013-08-26 LAB — CBC WITH DIFFERENTIAL/PLATELET
Basophils Relative: 0 % (ref 0–1)
Eosinophils Absolute: 0 10*3/uL (ref 0.0–0.7)
Hemoglobin: 14.2 g/dL (ref 13.0–17.0)
Lymphs Abs: 2.3 10*3/uL (ref 0.7–4.0)
MCH: 29.2 pg (ref 26.0–34.0)
MCHC: 34 g/dL (ref 30.0–36.0)
MCV: 85.8 fL (ref 78.0–100.0)
Monocytes Absolute: 1.1 10*3/uL — ABNORMAL HIGH (ref 0.1–1.0)
Neutrophils Relative %: 78 % — ABNORMAL HIGH (ref 43–77)
Platelets: 321 10*3/uL (ref 150–400)

## 2013-08-26 MED ORDER — METOCLOPRAMIDE HCL 5 MG/ML IJ SOLN
10.0000 mg | Freq: Once | INTRAMUSCULAR | Status: AC
  Administered 2013-08-26: 10 mg via INTRAVENOUS
  Filled 2013-08-26: qty 2

## 2013-08-26 MED ORDER — SODIUM CHLORIDE 0.9 % IV BOLUS (SEPSIS)
1000.0000 mL | Freq: Once | INTRAVENOUS | Status: AC
  Administered 2013-08-26: 1000 mL via INTRAVENOUS

## 2013-08-26 MED ORDER — SODIUM CHLORIDE 0.45 % IV BOLUS: 1000.0000 mL | Freq: Once | INTRAVENOUS | Status: DC

## 2013-08-26 NOTE — ED Notes (Signed)
Bedside commode placed in room. Pt alert, interactive, calm at this time. States feels weaker than normal

## 2013-08-26 NOTE — ED Provider Notes (Signed)
4:51 PM Patient seen and evaluated by gastroenterology at the bedside.  No significant active bleeding occurring.  They believe the patient is safe for discharge home.  GI office will contact him tomorrow with outpatient followup.  He understands to return to the ER for new or sitting symptoms including worsening bleeding.  Lyanne Co, MD 08/26/13 [phone]

## 2013-08-26 NOTE — ED Notes (Signed)
Pt comfortable with d/c and f/u instructions. No prescriptions 

## 2013-08-26 NOTE — ED Notes (Signed)
Reports eating at 1030 and then onset of generalized abd pain. Went to restroom and reports bowel movement consisted of large amounts of bright red blood. Denies vomiting. History of same within past 2 months.

## 2013-08-26 NOTE — Telephone Encounter (Signed)
Patient's sister called and reported that her brother called her to report a large amount of bright red rectal bleeding. Patient according to the sister speaks no [place].    She translated for me he reports"32 oz or two full water bottles full of blood watery dark blood".  He has a history of GI bleed in the past and a hemorrhoidal banding on 08/09/13 with Dr. Leone Payor.  He is actually a Dr. Jarold Motto patient. His sister is advised that he needs the nearest ER and should not drive himself.  She reports that he is an hour away and she will tell him to go to the ER.  I again stressed he should not drive himself and go to the nearest ER and not try and come to [facility] if there is a closer ER.  She verblalized understanding.

## 2013-08-26 NOTE — Consult Note (Signed)
[facility] Consult: 3:58 PM 08/26/2013  LOS: 0 days    Referring Provider: ED staff.  Primary Care Physician:  Oliver Barre, MD Primary Gastroenterologist:  Dr. Jarold Motto and Leone Payor     Reason for Consultation:  Hematochezia.    HPI: Samuel Meza is a 42 y.o. male.  Hx of ugib from site of endoscopic biopsy in 06/2013, the bleeding required endoclipping.  Hx hemorrhoidal bleeding.  Colonoscopy on 06/14/13.  In office hemorrhoidal banding using the O'Regan system  by Dr Leone Payor on 08/09/2013.  Is to return for GI ROV and possible repeat banding in 10/2013.  Doing well with only one incidence of blood noted on tissue since 11/3. Today he has had a total of 2 episodes of larger volume rectal bleeding.  The first was at work about 10:30 AM, the blood caused commode water to turn deep red.  The second was in the ED, the water was less bloody.  Family took a photo and on can clearly see the bottom of the commode through clear red, bloody water.    No dizziness, no presyncope.  Some minor abdominal cramps before onset of first episode, did not last long.  Has not used any ASA or NSAIDs.  Compliant with daily Nexium.  Last week given Tramadol and taper of prednisone for neck pain by his PMD. Steroid taper finishes tomorrow.      Past Medical History  Diagnosis Date  . Hyperlipidemia   . Thyroid disease   . Gout   . Diabetes mellitus without complication   . Hemorrhoids, internal, with bleeding 07/20/2013    Past Surgical History  Procedure Laterality Date  . Esophagogastroduodenoscopy N/A 06/16/2013    Procedure: ESOPHAGOGASTRODUODENOSCOPY (EGD);  Surgeon: Rachael Fee, MD;  Location: [facility] ENDOSCOPY;  Service: Endoscopy;  Laterality: N/A;  . Colonoscopy  2014  . Hemorrhoid banding  2014    Prior to Admission medications   Medication Sig Start Date End Date Taking? Authorizing Provider  allopurinol (ZYLOPRIM) 300 MG tablet Take 300 mg by  mouth daily. 06/29/13  Yes Corwin Levins, MD  esomeprazole (NEXIUM) 40 MG capsule Take 1 capsule (40 mg total) by mouth daily before breakfast. 06/29/13  Yes Corwin Levins, MD  levothyroxine (SYNTHROID, LEVOTHROID) 75 MCG tablet Take 1 tablet (75 mcg total) by mouth daily. 06/29/13  Yes Corwin Levins, MD  predniSONE (DELTASONE) 10 MG tablet 3 tabs by mouth per day for 3 days,2tabs per day for 3 days,1tab per day for 3 days 08/18/13  Yes Corwin Levins, MD  simvastatin (ZOCOR) 20 MG tablet Take 20 mg by mouth daily.   Yes Historical Provider, MD  traMADol (ULTRAM) 50 MG tablet Take 50 mg by mouth every 6 (six) hours as needed for moderate pain.   Yes Historical Provider, MD    Scheduled Meds:  Infusions:  PRN Meds:      Allergies as of 08/26/2013  . (No Known Allergies)    Family History  Problem Relation Age of Onset  . Hypertension Other   . Hyperlipidemia Other   . Hyperlipidemia Mother   . Hypertension Mother   . Hyperlipidemia Father   . Hypertension Father   . Alcohol abuse Neg Hx   . Cancer Neg Hx   . Diabetes Neg Hx   . Early death Neg Hx   . Heart disease Neg Hx   . Kidney disease Neg Hx   . Stroke Neg Hx     History   Social  History  . Marital Status: Not married but has long term male partner.     Spouse Name: N/A    Number of Children: N/A  . Years of Education: N/A   Occupational History  . Not on file.   Social History Main Topics  . Smoking status: Never Smoker   . Smokeless tobacco: Never Used  . Alcohol Use: Yes     Comment: ocas  . Drug Use: No  . Sexual Activity: Did not query.    Social History Narrative  . Works as a Advertising account planner    REVIEW OF SYSTEMS: Per HPI.  Otherwise 12 system review negative.  Flu and t dap this year.    PHYSICAL EXAM: Vital signs in last 24 hours: Filed Vitals:   08/26/13 1547  BP: 98/62  Pulse: 92  Temp:   Resp:    Wt Readings from Last 3 Encounters:  08/26/13 53.706 kg (118 lb 6.4 oz)  08/18/13  54.432 kg (120 lb)  08/09/13 54.885 kg (121 lb)   General: pleasant, asian male in NAD Sister is translator Head:  RRR  Eyes:  No pallor Ears:  Not HOH  Nose:  No discharge Mouth:  Clear and moist oral MM Neck:  No masses Lungs:  Clear.  Breathing not labored Heart: RRR.  No MRG Abdomen:  Soft, NT, ND.  No mass or HSM.   Rectal: no visible hemorrhoids.  Small amount of blood on exam glove   Musc/Skeltl: no joint swelling or deformity Extremities:  No CCE  Neurologic:  No tremor or deficits Skin:  No rash Tattoos:  none   Psych:  Pleasant, relaxed.   Intake/Output from previous day:   Intake/Output this shift:    LAB RESULTS:  Recent Labs  08/26/13 1317  WBC 15.3*  HGB 14.2  HCT 41.8  PLT 321   BMET Lab Results  Component Value Date   NA 139 08/26/2013   NA 139 08/18/2013   NA 137 04/22/2013   K 3.7 08/26/2013   K 3.7 08/18/2013   K 4.0 04/22/2013   CL 102 08/26/2013   CL 100 08/18/2013   CL 101 04/22/2013   CO2 26 08/26/2013   CO2 31 08/18/2013   CO2 29 04/22/2013   GLUCOSE 123* 08/26/2013   GLUCOSE 105* 08/18/2013   GLUCOSE 78 04/22/2013   BUN 16 08/26/2013   BUN 15 08/18/2013   BUN 15 04/22/2013   CREATININE 0.94 08/26/2013   CREATININE 0.9 08/18/2013   CREATININE 0.9 04/22/2013   CALCIUM 9.3 08/26/2013   CALCIUM 9.4 08/18/2013   CALCIUM 9.7 04/22/2013   LFT  Recent Labs  08/26/13 1317  PROT 7.4  ALBUMIN 3.9  AST 18  ALT 16  ALKPHOS 38*  BILITOT 0.3   PT/INR No results found for this basename: INR, PROTIME    RADIOLOGY STUDIES: No results found.  ENDOSCOPIC STUDIES: 06/16/2013  EGD Dr Christella Hartigan For UGIB Oozing and small VV in D2 at site of recent biopsy, was endoclipped.  Low risk for rebleeding.   06/14/2013   EGD  Dr Jarold Motto For dyspepsia ENDOSCOPIC IMPRESSION:  1. The duodenal mucosa showed no abnormalities in the bulb and  second portion of the duodenum ...biopsies done  2. The mucosa of the stomach appeared normal; biopsy .Marland KitchenCLO  Bx.  done  3. The mucosa of the esophagus appeared normal ..large HH and  chronic GERD  06/14/2013  Colonoscopy  Dr Jarold Motto For rectal bleeding ENDOSCOPIC IMPRESSION:  1. Moderate sized internal  hemorrhoids  2. Normal colon ..no polyps noted.  RECOMMENDATIONS:  Continue current medications  Referral for hemorrhoidal banding  Endoscopy today   IMPRESSION:   *  Hematochezia.  Almost certainly this is bleeding from site where hemorrhoid was banded, likely has an ulcer at the site Bleeding is slowing but has not ceased.  Hemodynamically stable with excellent Hgb.     PLAN:     *  Per Dr Christella Hartigan.  Likely will go home and have pt contact us in AM.  If  Ongoing bleeding overnight, will set up for flex sig with hemostatic therapy tomorrow. Pt prefers this option.  All s/w pt done via his sister Marijo Conception   Jennye Moccasin  08/26/2013, 3:58 PM Pager: [phone]    ________________________________________________________________________  [facility] MD note:  I personally examined the patient, reviewed the data and agree with the assessment and plan described above.  I performed anoscopy in ER, poor visualization but no very recent red blood.  I did not see ulcer site.  Bleeding is clinically slowing, given age, Hb I think safe for him to go home.  If bleeding persists overnight, increases in pace then will proceed with flex sig in endoscopy to treat the site.  I presume bleeding is from ulcer from recent hemorrhoid banding.   Rob Bunting, MD [facility] Pager [phone]

## 2013-08-26 NOTE — ED Provider Notes (Signed)
Medical screening examination/treatment/procedure(s) were performed by non-physician practitioner and as supervising physician I was immediately available for consultation/collaboration.   Sharrell Krawiec R Petra Sargeant, MD 08/26/13 1604 

## 2013-08-26 NOTE — ED Provider Notes (Signed)
CSN: [id number]     Arrival date & time 08/26/13  1247 History   First MD Initiated Contact with Patient 08/26/13 1308     Chief Complaint  Patient presents with  . GI Bleeding  . Abdominal Pain   (Consider location/radiation/quality/duration/timing/severity/associated sxs/prior Treatment) Patient is a 42 y.o. male presenting with abdominal pain. The history is provided by the patient. No language interpreter was used.  Abdominal Pain Pain location:  Generalized Pain quality: aching and cramping   Pain radiates to:  Does not radiate Associated symptoms: no chills, no dysuria, no fever, no hematuria, no nausea and no vomiting   Associated symptoms comment:  Generalized abdominal pain today with subsequent large amount of blood per rectum. He quantifies the bleeding as "2 water bottles full" - approximately 32 oz. No stool present. He had another episode of bleeding after arrival to ED. No nausea or vomiting. He reports recent history of endoscopies in September secondary to bleeding and melena, with subsequent bleed requiring cauterization of the duodenal bleed site by EGD. He also has a history of internal hemorrhoids, last banded in office on August 15, 2013. He has been feeling generally weak for the last several weeks and this is worse today. No syncope or presyncope.    Past Medical History  Diagnosis Date  . Hyperlipidemia   . Thyroid disease   . Gout   . Diabetes mellitus without complication   . Hemorrhoids, internal, with bleeding 07/20/2013   Past Surgical History  Procedure Laterality Date  . Esophagogastroduodenoscopy N/A 06/16/2013    Procedure: ESOPHAGOGASTRODUODENOSCOPY (EGD);  Surgeon: Rachael Fee, MD;  Location: [facility] ENDOSCOPY;  Service: Endoscopy;  Laterality: N/A;  . Colonoscopy  2014  . Hemorrhoid banding  2014   Family History  Problem Relation Age of Onset  . Hypertension Other   . Hyperlipidemia Other   . Hyperlipidemia Mother   . Hypertension Mother    . Hyperlipidemia Father   . Hypertension Father   . Alcohol abuse Neg Hx   . Cancer Neg Hx   . Diabetes Neg Hx   . Early death Neg Hx   . Heart disease Neg Hx   . Kidney disease Neg Hx   . Stroke Neg Hx    History  Substance Use Topics  . Smoking status: Never Smoker   . Smokeless tobacco: Never Used  . Alcohol Use: Yes     Comment: ocas    Review of Systems  Constitutional: Negative for fever and chills.  Gastrointestinal: Positive for abdominal pain and blood in stool. Negative for nausea and vomiting.  Genitourinary: Negative.  Negative for dysuria and hematuria.  Musculoskeletal: Negative.  Negative for back pain and myalgias.  Skin: Positive for pallor.  Neurological: Positive for weakness.  Psychiatric/Behavioral: Negative for confusion.    Allergies  Review of patient's allergies indicates no known allergies.  Home Medications   Current Outpatient Rx  Name  Route  Sig  Dispense  Refill  . allopurinol (ZYLOPRIM) 300 MG tablet   Oral   Take 300 mg by mouth daily.         Marland Kitchen esomeprazole (NEXIUM) 40 MG capsule   Oral   Take 1 capsule (40 mg total) by mouth daily before breakfast.   90 capsule   3   . levothyroxine (SYNTHROID, LEVOTHROID) 75 MCG tablet   Oral   Take 1 tablet (75 mcg total) by mouth daily.   90 tablet   3   . predniSONE (DELTASONE) 10  MG tablet      3 tabs by mouth per day for 3 days,2tabs per day for 3 days,1tab per day for 3 days   18 tablet   0   . simvastatin (ZOCOR) 20 MG tablet   Oral   Take 20 mg by mouth daily.         . traMADol (ULTRAM) 50 MG tablet   Oral   Take 50 mg by mouth every 6 (six) hours as needed for moderate pain.          BP 108/72  Pulse 99  Temp(Src) 98 F (36.7 C) (Oral)  Resp 18  Wt 118 lb 6.4 oz (53.706 kg)  SpO2 99% Physical Exam  Constitutional: He is oriented to person, place, and time. He appears well-developed and well-nourished.  HENT:  Head: Normocephalic.  Eyes:  No conjunctival  pallor.  Neck: Normal range of motion. Neck supple.  Cardiovascular: Normal rate and regular rhythm.   Pulmonary/Chest: Effort normal and breath sounds normal.  Abdominal: Soft. Bowel sounds are normal. There is tenderness. There is no rebound and no guarding.  Periumbilical tendernss to soft abdomen.  Genitourinary:  Grossly blood on rectal exam.  Musculoskeletal: Normal range of motion.  Neurological: He is alert and oriented to person, place, and time.  Skin: Skin is warm and dry. No rash noted.  Psychiatric: He has a normal mood and affect.    ED Course  Procedures (including critical care time) Labs Review Labs Reviewed  CBC WITH DIFFERENTIAL - Abnormal; Notable for the following:    WBC 15.3 (*)    All other components within normal limits  COMPREHENSIVE METABOLIC PANEL - Abnormal; Notable for the following:    Glucose, Bld 123 (*)    Alkaline Phosphatase 38 (*)    All other components within normal limits   Imaging Review No results found.  EKG Interpretation   None       MDM  No diagnosis found. 1. Rectal bleeding  Discussed with Lianne Bushy, GI PA who is in the department to see the patient. Disposition to be decided with GI input and Dr. Patria Mane.     Arnoldo Hooker, PA-C 08/26/13 1552

## 2013-08-26 NOTE — ED Notes (Signed)
Gastro at bedside.

## 2013-08-27 ENCOUNTER — Telehealth: Payer: Self-pay | Admitting: Internal Medicine

## 2013-08-27 NOTE — Telephone Encounter (Signed)
Informed niece that Dr Leone Payor has reviewed the ER visit and he would like for the pt to come in Monday to see a mid level and he will see him after the visit. He is instructed to call the on call doc over the weekend.

## 2013-08-27 NOTE — Telephone Encounter (Signed)
Pt's niece reports pt wants to know what the next step in after last night's ER visit. She states the pt reports the "dripping" blood has stopped. He had a BM this am with old clotted dark blood. Please advice. Thanks

## 2013-08-30 ENCOUNTER — Other Ambulatory Visit (INDEPENDENT_AMBULATORY_CARE_PROVIDER_SITE_OTHER): Payer: BC Managed Care – PPO

## 2013-08-30 ENCOUNTER — Ambulatory Visit (INDEPENDENT_AMBULATORY_CARE_PROVIDER_SITE_OTHER): Payer: BC Managed Care – PPO | Admitting: Physician Assistant

## 2013-08-30 ENCOUNTER — Encounter: Payer: Self-pay | Admitting: Physician Assistant

## 2013-08-30 VITALS — BP 108/60 | HR 66 | Ht 63.5 in | Wt 119.0 lb

## 2013-08-30 DIAGNOSIS — K648 Other hemorrhoids: Secondary | ICD-10-CM

## 2013-08-30 DIAGNOSIS — K625 Hemorrhage of anus and rectum: Secondary | ICD-10-CM

## 2013-08-30 LAB — PROTIME-INR: INR: 1.1 ratio — ABNORMAL HIGH (ref 0.8–1.0)

## 2013-08-30 MED ORDER — DICYCLOMINE HCL 10 MG PO CAPS: ORAL_CAPSULE | ORAL | Status: DC

## 2013-08-30 NOTE — Patient Instructions (Signed)
Please go to the basement level to have your labs drawn.  We sent a prescription to [facility] [address]   Call if you have any recurrent bleeding.  Keep the appointment with Dr. Leone Payor for 10-12-2012 at 9:45 am.

## 2013-08-30 NOTE — Progress Notes (Signed)
Subjective:    Patient ID: Samuel Meza, male    DOB: 19-Jun-1971, 42 y.o.   MRN: [id number]  HPI  Samuel Meza is a pleasant 42 year old [place] male who is accompanied by his brother who does most of the interpretation. He is known to Dr. Leone Payor with history of internal hemorrhoids. He had undergone a hemorrhoid banding procedure on October 14 and again on November 3. He had onset of rectal bleeding on 08/26/2013 which precipitated an emergency room visit. He was seen and evaluated by Dr. Christella Hartigan felt to be having low-grade bleeding and was allowed discharged home. His hemoglobin at that time was 14.2 hematocrit of 41.8. Patient states that he had the abrupt onset of bleeding on Thursday 1120 in the morning and had 5 episodes of bright red blood per rectum. He did not have any associated abdominal pain cramping or rectal pain. He said list says later in the day his stool turned very dark and then each day  since he has had one dark appearing bowel movement but no further bright red blood. He states today his bowel movementappeared more normal. He also relates that he is been having some abdominal discomfort after eating over the past couple of weeks and what sounds like her urgency but no bowel movements. Is not on any regular aspirin or NSAIDs. Colonoscopy had been done in September of 2014 per Dr. Jarold Motto and was normal with the exception of moderate internal hemorrhoids. He had also had an EGD in September of 2014 which was unremarkable he did have biopsies taken from the stomach and the duodenum and then had a bleed after that requiring repeat EGD with Endo Clip of a bleeding vessel from the biopsy site in the second portion of his duodenum.    Review of Systems  Constitutional: Negative.   HENT: Negative.   Eyes: Negative.   Respiratory: Negative.  Negative for apnea.   Cardiovascular: Negative.   Gastrointestinal: Positive for anal bleeding.  Endocrine: Negative.   Genitourinary: Negative.    Musculoskeletal: Negative.   Allergic/Immunologic: Negative.   Neurological: Negative.   Hematological: Negative.   Psychiatric/Behavioral: Negative.    Outpatient Prescriptions Prior to Visit  Medication Sig Dispense Refill  . allopurinol (ZYLOPRIM) 300 MG tablet Take 300 mg by mouth daily.      Marland Kitchen esomeprazole (NEXIUM) 40 MG capsule Take 1 capsule (40 mg total) by mouth daily before breakfast.  90 capsule  3  . levothyroxine (SYNTHROID, LEVOTHROID) 75 MCG tablet Take 1 tablet (75 mcg total) by mouth daily.  90 tablet  3  . simvastatin (ZOCOR) 20 MG tablet Take 20 mg by mouth daily.      . traMADol (ULTRAM) 50 MG tablet Take 50 mg by mouth every 6 (six) hours as needed for moderate pain.      . predniSONE (DELTASONE) 10 MG tablet 3 tabs by mouth per day for 3 days,2tabs per day for 3 days,1tab per day for 3 days  18 tablet  0   No facility-administered medications prior to visit.      No Known Allergies Patient Active Problem List   Diagnosis Date Noted  . Cervical radiculitis 08/18/2013  . Hemoptysis 08/18/2013  . Hemorrhoids, internal, with bleeding 07/20/2013  . Hand pain 06/29/2013  . Unspecified constipation 04/22/2013  . Other abnormal glucose 08/03/2012  . Gout 02/04/2012  . Hypothyroid 12/25/2011  . Hyperlipidemia 12/25/2011   History  Substance Use Topics  . Smoking status: Never Smoker   . Smokeless tobacco:  Never Used  . Alcohol Use: Yes     Comment: ocas   family history includes Hyperlipidemia in his father, mother, and other; Hypertension in his father, mother, and other. There is no history of Alcohol abuse, Cancer, Diabetes, Early death, Heart disease, Kidney disease, or Stroke.  Objective:   Physical Exam well-developed [place] male in no acute distress, blood pressure 108/60 pulse 66 height 5 foot 3 weight 119. HEENT; nontraumatic normocephalic EOMI PERRLA sclera anicteric, Supple; no JVD, Cardiovascular; regular rate and rhythm with S1-S2 no murmur or  gallop, Pulm;clear bilaterally, Abdomen ;soft basically nontender there is no palpable mass or hepatosplenomegaly bowel sounds are active, Rectal; exam and anoscopy done per Dr. Leone Payor with finding of one residual hemorrhoid at the right anterior position there was also a very tiny clot in this area no ulcer was seen stool is brown, Extremities; no clubbing cyanosis or edema skin warm dry, Psych; mood and affect appropriate        Assessment & Plan:   #44  42 year old male with known internal hemorrhoids status post banding procedure on 07/20/2013 and a second banding on November 3 who presented with bright red blood per rectum on November 20. This is presumably secondary to hemorrhoidal bleeding and probable ulceration at the banding site. His hemoglobin was normal , there is no evidence for her ongoing active bleeding stool is brown today and anoscopy is negative with the exception of a residual hemorrhoid and a tiny clot.  #2 GI bleed September 2014 after routine biopsies were done with the EGD from the stomach and duodenum , Patient requires second EGD for hemostasis with Endo Clip of biopsy site in the second portion of the duodenum. 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 Trial of Bentyl 10 mg one half hour before meals Patient will followup with Dr. Leone Payor in early January and will consider repeat banding procedure for the residual right anterior internal hemorrhoid. He is advised to call for any problems with repeat bleeding in the antrum

## 2013-08-30 NOTE — Progress Notes (Signed)
Agree with Ms. Esterwood's assessment and plan. Carl E. Gessner, MD, FACG   

## 2013-10-12 ENCOUNTER — Encounter: Payer: Self-pay | Admitting: Internal Medicine

## 2013-10-12 ENCOUNTER — Ambulatory Visit (INDEPENDENT_AMBULATORY_CARE_PROVIDER_SITE_OTHER): Payer: BC Managed Care – PPO | Admitting: Internal Medicine

## 2013-10-12 VITALS — BP 100/70 | HR 78 | Ht 63.0 in | Wt 120.4 lb

## 2013-10-12 DIAGNOSIS — K648 Other hemorrhoids: Secondary | ICD-10-CM

## 2013-10-12 NOTE — Progress Notes (Signed)
Patient ID: Samuel Meza, male   DOB: 20-Oct-1970, 43 y.o.   MRN: [id number]  PROCEDURE NOTE: The patient presents with symptomatic grade 1  hemorrhoids, requesting rubber band ligation of his/her hemorrhoidal disease.  All risks, benefits and alternative forms of therapy were described and informed consent was obtained. Has been banded in all 3 positions but still having slight rectal bleeding on tissue paper.  In the Left Lateral Decubitus position anoscopic examination revealed grade 1 hemorrhoids in the left lateral posterior positin, erythematous and with some inflammatory change  The decision was made to band the LL/posterior internal hemorrhoid, and the [place] was used to perform band ligation without complication.  Digital anorectal examination was then performed to assure proper positioning of the band, and to adjust the banded tissue as required.  The patient was discharged home without pain or other issues.  Dietary and behavioral recommendations were given and along with follow-up instructions.     The following adjunctive treatments were recommended:  Continue fiber supplements  The patient will return as needed for  follow-up and possible additional banding as required. No complications were encountered and the patient tolerated the procedure well.

## 2013-10-12 NOTE — Assessment & Plan Note (Signed)
LL/post ligated after seeing prominent hemorrhoid tissue in that region

## 2013-10-12 NOTE — Patient Instructions (Signed)
Follow up with [place] as needed.  HEMORRHOID BANDING PROCEDURE    FOLLOW-UP CARE   1. The procedure you have had should have been relatively painless since the banding of the area involved does not have nerve endings and there is no pain sensation.  The rubber band cuts off the blood supply to the hemorrhoid and the band may fall off as soon as 48 hours after the banding (the band may occasionally be seen in the toilet bowl following a bowel movement). You may notice a temporary feeling of fullness in the rectum which should respond adequately to plain Tylenol or Motrin.  2. Following the banding, avoid strenuous exercise that evening and resume full activity the next day.  A sitz bath (soaking in a warm tub) or bidet is soothing, and can be useful for cleansing the area after bowel movements.     3. To avoid constipation, take two tablespoons of natural wheat bran, natural oat bran, flax, Benefiber or any over the counter fiber supplement and increase your water intake to 7-8 glasses daily.    4. Unless you have been prescribed anorectal medication, do not put anything inside your rectum for two weeks: No suppositories, enemas, fingers, etc.  5. Occasionally, you may have more bleeding than usual after the banding procedure.  This is often from the untreated hemorrhoids rather than the treated one.  Don't be concerned if there is a tablespoon or so of blood.  If there is more blood than this, lie flat with your bottom higher than your head and apply an ice pack to the area. If the bleeding does not stop within a half an hour or if you feel faint, call our office at [phone] or go to the emergency room.  6. Problems are not common; however, if there is a substantial amount of bleeding, severe pain, chills, fever or difficulty passing urine (very rare) or other problems, you should call us at [phone] or report to the nearest emergency room.  7. Do not stay seated continuously for more  than 2-3 hours for a day or two after the procedure.  Tighten your buttock muscles 10-15 times every two hours and take 10-15 deep breaths every 1-2 hours.  Do not spend more than a few minutes on the toilet if you cannot empty your bowel; instead re-visit the toilet at a later time.    I appreciate the opportunity to care for you.

## 2013-12-28 ENCOUNTER — Ambulatory Visit: Payer: BC Managed Care – PPO | Admitting: Internal Medicine

## 2014-01-05 ENCOUNTER — Ambulatory Visit: Payer: BC Managed Care – PPO | Admitting: Internal Medicine

## 2014-01-18 ENCOUNTER — Other Ambulatory Visit (INDEPENDENT_AMBULATORY_CARE_PROVIDER_SITE_OTHER): Payer: BC Managed Care – PPO

## 2014-01-18 ENCOUNTER — Encounter: Payer: Self-pay | Admitting: Internal Medicine

## 2014-01-18 ENCOUNTER — Ambulatory Visit (INDEPENDENT_AMBULATORY_CARE_PROVIDER_SITE_OTHER): Payer: BC Managed Care – PPO | Admitting: Internal Medicine

## 2014-01-18 VITALS — BP 100/70 | HR 57 | Temp 97.8°F | Ht 64.0 in | Wt 125.8 lb

## 2014-01-18 DIAGNOSIS — R29898 Other symptoms and signs involving the musculoskeletal system: Secondary | ICD-10-CM | POA: Insufficient documentation

## 2014-01-18 DIAGNOSIS — R202 Paresthesia of skin: Secondary | ICD-10-CM

## 2014-01-18 DIAGNOSIS — M79609 Pain in unspecified limb: Secondary | ICD-10-CM

## 2014-01-18 DIAGNOSIS — M109 Gout, unspecified: Secondary | ICD-10-CM

## 2014-01-18 DIAGNOSIS — K130 Diseases of lips: Secondary | ICD-10-CM | POA: Insufficient documentation

## 2014-01-18 DIAGNOSIS — Z0000 Encounter for general adult medical examination without abnormal findings: Secondary | ICD-10-CM

## 2014-01-18 DIAGNOSIS — M79602 Pain in left arm: Secondary | ICD-10-CM

## 2014-01-18 DIAGNOSIS — R7302 Impaired glucose tolerance (oral): Secondary | ICD-10-CM

## 2014-01-18 DIAGNOSIS — K219 Gastro-esophageal reflux disease without esophagitis: Secondary | ICD-10-CM

## 2014-01-18 DIAGNOSIS — M542 Cervicalgia: Secondary | ICD-10-CM

## 2014-01-18 DIAGNOSIS — R7309 Other abnormal glucose: Secondary | ICD-10-CM

## 2014-01-18 DIAGNOSIS — R209 Unspecified disturbances of skin sensation: Secondary | ICD-10-CM

## 2014-01-18 DIAGNOSIS — M79601 Pain in right arm: Secondary | ICD-10-CM

## 2014-01-18 LAB — LIPID PANEL
CHOL/HDL RATIO: 3
Cholesterol: 108 mg/dL (ref 0–200)
HDL: 37.2 mg/dL — ABNORMAL LOW (ref >39.00)
LDL CALC: 55 mg/dL (ref 0–99)
TRIGLYCERIDES: 78 mg/dL (ref 0.0–149.0)
VLDL: 15.6 mg/dL (ref 0.0–40.0)

## 2014-01-18 LAB — URINALYSIS, ROUTINE W REFLEX MICROSCOPIC
Bilirubin Urine: NEGATIVE
Ketones, ur: NEGATIVE
Leukocytes, UA: NEGATIVE
NITRITE: NEGATIVE
PH: 6 (ref 5.0–8.0)
Specific Gravity, Urine: <=1.005 — AB (ref 1.000–1.030)
TOTAL PROTEIN, URINE-UPE24: NEGATIVE
Urine Glucose: NEGATIVE
Urobilinogen, UA: 0.2 (ref 0.0–1.0)

## 2014-01-18 LAB — CBC WITH DIFFERENTIAL/PLATELET
BASOS PCT: 0.5 % (ref 0.0–3.0)
Basophils Absolute: 0 10*3/uL (ref 0.0–0.1)
EOS ABS: 0 10*3/uL (ref 0.0–0.7)
EOS PCT: 0.7 % (ref 0.0–5.0)
HEMATOCRIT: 43.7 % (ref 39.0–52.0)
HEMOGLOBIN: 14.4 g/dL (ref 13.0–17.0)
LYMPHS ABS: 1.9 10*3/uL (ref 0.7–4.0)
Lymphocytes Relative: 27.4 % (ref 12.0–46.0)
MCHC: 33 g/dL (ref 30.0–36.0)
MCV: 86.1 fl (ref 78.0–100.0)
Monocytes Absolute: 0.5 10*3/uL (ref 0.1–1.0)
Monocytes Relative: 7.8 % (ref 3.0–12.0)
Neutro Abs: 4.3 10*3/uL (ref 1.4–7.7)
Neutrophils Relative %: 63.6 % (ref 43.0–77.0)
Platelets: 229 10*3/uL (ref 150.0–400.0)
RBC: 5.08 Mil/uL (ref 4.22–5.81)
RDW: 14.2 % (ref 11.5–14.6)
WBC: 6.8 10*3/uL (ref 4.5–10.5)

## 2014-01-18 LAB — URIC ACID: URIC ACID, SERUM: 4.6 mg/dL (ref 4.0–7.8)

## 2014-01-18 LAB — HEPATIC FUNCTION PANEL
ALT: 32 U/L (ref 0–53)
AST: 30 U/L (ref 0–37)
Albumin: 4.4 g/dL (ref 3.5–5.2)
Alkaline Phosphatase: 34 U/L — ABNORMAL LOW (ref 39–117)
BILIRUBIN DIRECT: 0.1 mg/dL (ref 0.0–0.3)
BILIRUBIN TOTAL: 0.3 mg/dL (ref 0.3–1.2)
Total Protein: 7.7 g/dL (ref 6.0–8.3)

## 2014-01-18 LAB — BASIC METABOLIC PANEL
BUN: 12 mg/dL (ref 6–23)
CHLORIDE: 101 meq/L (ref 96–112)
CO2: 30 mEq/L (ref 19–32)
CREATININE: 0.8 mg/dL (ref 0.4–1.5)
Calcium: 9.8 mg/dL (ref 8.4–10.5)
GFR: 116.09 mL/min (ref >60.00)
Glucose, Bld: 90 mg/dL (ref 70–99)
Potassium: 4.5 mEq/L (ref 3.5–5.1)
Sodium: 139 mEq/L (ref 135–145)

## 2014-01-18 LAB — HEMOGLOBIN A1C: Hgb A1c MFr Bld: 6.1 % (ref 4.6–6.5)

## 2014-01-18 LAB — TSH: TSH: 4.25 u[IU]/mL (ref 0.35–5.50)

## 2014-01-18 LAB — PSA: PSA: 1.22 ng/mL (ref 0.10–4.00)

## 2014-01-18 MED ORDER — SIMVASTATIN 20 MG PO TABS: 20.0000 mg | ORAL_TABLET | Freq: Every day | ORAL | Status: DC

## 2014-01-18 MED ORDER — ESOMEPRAZOLE MAGNESIUM 40 MG PO CPDR: 40.0000 mg | DELAYED_RELEASE_CAPSULE | Freq: Every day | ORAL | Status: DC

## 2014-01-18 MED ORDER — LEVOTHYROXINE SODIUM 75 MCG PO TABS: 75.0000 ug | ORAL_TABLET | Freq: Every day | ORAL | Status: DC

## 2014-01-18 MED ORDER — ALLOPURINOL 300 MG PO TABS: 300.0000 mg | ORAL_TABLET | Freq: Every day | ORAL | Status: DC

## 2014-01-18 NOTE — Assessment & Plan Note (Signed)
4+/5 bilat, not clear if CTS or cervical related or both, or other etiology, for bilat wrist splints at night, for C-spine MRI, consider neuro referral, consider NCS.EMG

## 2014-01-18 NOTE — Progress Notes (Signed)
Subjective:    Patient ID: Samuel Meza, male    DOB: Oct 22, 1970, 43 y.o.   MRN: [id number]  HPI  Here for wellness and f/u;  Overall doing ok;  Pt denies CP, worsening SOB, DOE, wheezing, orthopnea, PND, worsening LE edema, palpitations, dizziness or syncope.  Pt denies neurological change such as new headache, facial or extremity weakness.  Pt denies polydipsia, polyuria, or low sugar symptoms. Pt states overall good compliance with treatment and medications, good tolerability, and has been trying to follow lower cholesterol diet.  Pt denies worsening depressive symptoms, suicidal ideation or panic. No fever, night sweats, wt loss, loss of appetite, or other constitutional symptoms.  Pt states good ability with ADL's, has low fall risk, home safety reviewed and adequate, no other significant changes in hearing or vision, and only occasionally active with exercise.  Pt continues to have recurring neck pain without change in severity, and no bowel or bladder change, fever, wt loss, gait change or falls, but with bilat UE mild loss of grip strength and numbness that pervades but UE to above the elbows at least. Works as Dance movement psychotherapist. No prior known hx of cspine dz, or CTS.   Also with an unusual mid upper lip raised lesion/hematoma/blister like, not clear why occurred, no trauma, fever, and single lesion only.  Had somewhat siimilar lesion to left lower lip at corner of mouth a few wks ago resolved. Past Medical History  Diagnosis Date  . Hyperlipidemia   . Thyroid disease   . Gout   . Diabetes mellitus without complication   . Hemorrhoids, internal, with bleeding 07/20/2013   Past Surgical History  Procedure Laterality Date  . Esophagogastroduodenoscopy N/A 06/16/2013    Procedure: ESOPHAGOGASTRODUODENOSCOPY (EGD);  Surgeon: Milus Banister, MD;  Location: [facility];  Service: Endoscopy;  Laterality: N/A;  . Colonoscopy  2014  . Hemorrhoid banding  2014    reports that he has never smoked. He  has never used smokeless tobacco. He reports that he drinks alcohol. He reports that he does not use illicit drugs. family history includes Hyperlipidemia in his father, mother, and other; Hypertension in his father, mother, and other. There is no history of Alcohol abuse, Cancer, Diabetes, Early death, Heart disease, Kidney disease, or Stroke. No Known Allergies Current Outpatient Prescriptions on File Prior to Visit  Medication Sig Dispense Refill  . dicyclomine (BENTYL) 10 MG capsule Take 1 tab 30 minutes before meals.  60 capsule  1  . traMADol (ULTRAM) 50 MG tablet Take 50 mg by mouth every 6 (six) hours as needed for moderate pain.       No current facility-administered medications on file prior to visit.   Review of Systems Constitutional: Negative for diaphoresis, activity change, appetite change or unexpected weight change.  HENT: Negative for hearing loss, ear pain, facial swelling, mouth sores and neck stiffness.   Eyes: Negative for pain, redness and visual disturbance.  Respiratory: Negative for shortness of breath and wheezing.   Cardiovascular: Negative for chest pain and palpitations.  Gastrointestinal: Negative for diarrhea, blood in stool, abdominal distention or other pain Genitourinary: Negative for hematuria, flank pain or change in urine volume.  Musculoskeletal: Negative for myalgias and joint swelling.  Skin: Negative for color change and wound.  Neurological: Negative for syncope and numbness. other than noted Hematological: Negative for adenopathy.  Psychiatric/Behavioral: Negative for hallucinations, self-injury, decreased concentration and agitation.      Objective:   Physical Exam BP 100/70  Pulse  57  Temp(Src) 97.8 F (36.6 C) (Oral)  Ht 5\' 4"  (1.626 m)  Wt 125 lb 12 oz (57.04 kg)  BMI 21.57 kg/m2  SpO2 99% VS noted,  Constitutional: Pt is oriented to person, place, and time. Appears well-developed and well-nourished.  Head: Normocephalic and  atraumatic.  Right Ear: External ear normal.  Left Ear: External ear normal.  Nose: Nose normal.  Mouth/Throat: Oropharynx is clear and moist.  Eyes: Conjunctivae and EOM are normal. Pupils are equal, round, and reactive to light.  Neck: Normal range of motion. Neck supple. No JVD present. No tracheal deviation present.  Cardiovascular: Normal rate, regular rhythm, normal heart sounds and intact distal pulses.   Pulmonary/Chest: Effort normal and breath sounds normal.  Abdominal: Soft. Bowel sounds are normal. There is no tenderness. No HSM  Musculoskeletal: Normal range of motion. Exhibits no edema.  Lymphadenopathy:  Has no cervical adenopathy.  Neurological: Pt is alert and oriented to person, place, and time. Pt has normal reflexes. No cranial nerve deficit.  Skin: Skin is warm and dry. No rash noted. Has raised 6 mm lesion to mid upper lip, mild tender, dark color - ? Hematoma, ? blister Psychiatric:  Has  normal mood and affect. Behavior is normal.     Assessment & Plan:

## 2014-01-18 NOTE — Assessment & Plan Note (Signed)
Appears to be post traumatic, but unclear how this occurre, for oral surgeon if not improved 1-2 wks

## 2014-01-18 NOTE — Patient Instructions (Addendum)
Please wear the wrist splints at night to see if this helps the arm symptoms during the day You will be contacted regarding the referral for: MRI for the neck, to see if any nerve pinching coming from there  Please continue all other medications as before, and refills have been done if requested. Please have the pharmacy call with any other refills you may need.  Please continue your efforts at being more active, low cholesterol diet, and weight control. You are otherwise up to date with prevention measures today.  Please keep your appointments with your specialists as you may have planned  Please go to the LAB in the Basement (turn left off the elevator) for the tests to be done today You will be contacted by phone if any changes need to be made immediately.  Otherwise, you will receive a letter about your results with an explanation, but please check with MyChart first.  Please return in 6 months, or sooner if needed  Please call in 1 - 2 weeks if the lip lesion has not healed, for an oral surgury referral

## 2014-01-18 NOTE — Progress Notes (Signed)
Pre visit review using our clinic review tool, if applicable. No additional management support is needed unless otherwise documented below in the visit note. 

## 2014-03-01 ENCOUNTER — Ambulatory Visit (INDEPENDENT_AMBULATORY_CARE_PROVIDER_SITE_OTHER): Payer: BC Managed Care – PPO | Admitting: Internal Medicine

## 2014-03-01 ENCOUNTER — Encounter: Payer: Self-pay | Admitting: Internal Medicine

## 2014-03-01 VITALS — BP 104/70 | HR 57 | Temp 98.1°F | Wt 121.0 lb

## 2014-03-01 DIAGNOSIS — K5900 Constipation, unspecified: Secondary | ICD-10-CM

## 2014-03-01 DIAGNOSIS — K219 Gastro-esophageal reflux disease without esophagitis: Secondary | ICD-10-CM

## 2014-03-01 DIAGNOSIS — E039 Hypothyroidism, unspecified: Secondary | ICD-10-CM

## 2014-03-01 HISTORY — DX: Gastro-esophageal reflux disease without esophagitis: K21.9

## 2014-03-01 NOTE — Patient Instructions (Signed)
Please try OTC Miralax daily, as well as Colace 100 mg twice per day for stool softner  OK to hold on taking the Bentyl if you are having constipatoin  Please continue all other medications as before, including the nexium  Please call if not improved, or if there is further weight loss, as you may need to be referred back to GI, or even a CT scan of abdomen

## 2014-03-01 NOTE — Progress Notes (Signed)
Pre visit review using our clinic review tool, if applicable. No additional management support is needed unless otherwise documented below in the visit note. 

## 2014-03-01 NOTE — Assessment & Plan Note (Signed)
Ok to cont nexium 40 qd

## 2014-03-01 NOTE — Assessment & Plan Note (Signed)
stable overall by history and exam, recent data reviewed with pt, and pt to continue medical treatment as before,  to f/u any worsening symptoms or concerns Lab Results  Component Value Date   TSH 4.25 01/18/2014    

## 2014-03-01 NOTE — Progress Notes (Signed)
   Subjective:    Patient ID: Samuel Meza, male    DOB: 06-13-1971, 43 y.o.   MRN: [id number]  HPI here to f/u with family as interpretor; states ongoing early satiety, 4 lb wt loss since  Last visit, upper abd mild discomfort some improved with taking nexium in the past wk, mild bloating and reduced caliber stool, no blood, but + constipation recurrent.  No change recent activity or diet.  Last EGD/colonoscopy sept 2014 per Dr Sharlett Iles, and more recent banding hemorrhoid per Dr Carlean Purl.  Denies hyper or hypo thyroid symptoms such as voice, skin or hair change. Past Medical History  Diagnosis Date  . Hyperlipidemia   . Thyroid disease   . Gout   . Diabetes mellitus without complication   . Hemorrhoids, internal, with bleeding 07/20/2013   Past Surgical History  Procedure Laterality Date  . Esophagogastroduodenoscopy N/A 06/16/2013    Procedure: ESOPHAGOGASTRODUODENOSCOPY (EGD);  Surgeon: Milus Banister, MD;  Location: [facility];  Service: Endoscopy;  Laterality: N/A;  . Colonoscopy  2014  . Hemorrhoid banding  2014    reports that he has never smoked. He has never used smokeless tobacco. He reports that he drinks alcohol. He reports that he does not use illicit drugs. family history includes Hyperlipidemia in his father, mother, and other; Hypertension in his father, mother, and other. There is no history of Alcohol abuse, Cancer, Diabetes, Early death, Heart disease, Kidney disease, or Stroke. No Known Allergies Current Outpatient Prescriptions on File Prior to Visit  Medication Sig Dispense Refill  . allopurinol (ZYLOPRIM) 300 MG tablet Take 1 tablet (300 mg total) by mouth daily.  90 tablet  3  . dicyclomine (BENTYL) 10 MG capsule Take 1 tab 30 minutes before meals.  60 capsule  1  . esomeprazole (NEXIUM) 40 MG capsule Take 1 capsule (40 mg total) by mouth daily before breakfast.  90 capsule  3  . levothyroxine (SYNTHROID, LEVOTHROID) 75 MCG tablet Take 1 tablet (75 mcg total) by mouth  daily.  90 tablet  3  . simvastatin (ZOCOR) 20 MG tablet Take 1 tablet (20 mg total) by mouth daily.  90 tablet  3  . traMADol (ULTRAM) 50 MG tablet Take 50 mg by mouth every 6 (six) hours as needed for moderate pain.       No current facility-administered medications on file prior to visit.   Review of Systems All otherwise neg per pt     Objective:   Physical Exam BP 104/70  Pulse 57  Temp(Src) 98.1 F (36.7 C) (Oral)  Wt 121 lb (54.885 kg)  SpO2 97% VS noted,  Constitutional: Pt appears well-developed, well-nourished.  HENT: Head: NCAT.  Right Ear: External ear normal.  Left Ear: External ear normal.  Eyes: . Pupils are equal, round, and reactive to light. Conjunctivae and EOM are normal Neck: Normal range of motion. Neck supple.  Cardiovascular: Normal rate and regular rhythm.   Pulmonary/Chest: Effort normal and breath sounds normal.  Abd:  Soft, NT, ND, + BS Neurological: Pt is alert. Not confused , motor grossly intact Skin: Skin is warm. No rash Psychiatric: Pt behavior is normal. No agitation.     Assessment & Plan:

## 2014-03-01 NOTE — Assessment & Plan Note (Signed)
For miralax daily, and colace 100 bid, consider CT abd and/or referral GI for worsening abd discomfort or wt los

## 2014-06-27 ENCOUNTER — Ambulatory Visit (INDEPENDENT_AMBULATORY_CARE_PROVIDER_SITE_OTHER): Payer: BC Managed Care – PPO | Admitting: Internal Medicine

## 2014-06-27 ENCOUNTER — Encounter: Payer: Self-pay | Admitting: Internal Medicine

## 2014-06-27 ENCOUNTER — Ambulatory Visit (INDEPENDENT_AMBULATORY_CARE_PROVIDER_SITE_OTHER)
Admission: RE | Admit: 2014-06-27 | Discharge: 2014-06-27 | Disposition: A | Discharge status: Home / Self Care | Payer: BC Managed Care – PPO | Source: Ambulatory Visit | Attending: Internal Medicine | Admitting: Internal Medicine

## 2014-06-27 VITALS — BP 98/68 | HR 64 | Ht 63.0 in | Wt 124.0 lb

## 2014-06-27 DIAGNOSIS — K5900 Constipation, unspecified: Secondary | ICD-10-CM

## 2014-06-27 DIAGNOSIS — R05 Cough: Secondary | ICD-10-CM

## 2014-06-27 DIAGNOSIS — R1013 Epigastric pain: Secondary | ICD-10-CM

## 2014-06-27 DIAGNOSIS — R1010 Upper abdominal pain, unspecified: Secondary | ICD-10-CM

## 2014-06-27 DIAGNOSIS — R059 Cough, unspecified: Secondary | ICD-10-CM

## 2014-06-27 DIAGNOSIS — R109 Unspecified abdominal pain: Secondary | ICD-10-CM

## 2014-06-27 DIAGNOSIS — K3189 Other diseases of stomach and duodenum: Secondary | ICD-10-CM

## 2014-06-27 DIAGNOSIS — K648 Other hemorrhoids: Secondary | ICD-10-CM

## 2014-06-27 DIAGNOSIS — K219 Gastro-esophageal reflux disease without esophagitis: Secondary | ICD-10-CM

## 2014-06-27 NOTE — Assessment & Plan Note (Signed)
fiber

## 2014-06-27 NOTE — Progress Notes (Signed)
   Subjective:    Patient ID: MALAKYE NOLDEN, male    DOB: 1971/03/06, 43 y.o.   MRN: [id number]  HPI The patient is here with her friend or family Karle Starch has several complaints.  When he coughs he feels like he can take blood. He is not vomiting blood or producing any blood in the sputum.  He thinks Nexium is too expensive and like a cheaper alternative. He has upper abdominal discomfort or distress when he eats and feels full early. He believes he is losing weight. He has not been vomiting.  He still has occasional rectal bleeding with wiping. He is not using fiber supplementation as previously advised, he has had hemorrhoidal ligation in the past. Medications, allergies, past medical history, past surgical history, family history and social history are reviewed and updated in the EMR.   Review of Systems As above    Objective:   Physical Exam Thin Asian man in no acute distress Abdomen is soft and nontender no organomegaly or mass  lungs are clear Rectal exam reveals no mass. There is a slight protruding hemorrhoid in the left lateral position  Anoscopy is performed and demonstrates internal hemorrhoids in all 3 positions most prominent in the left lateral     Assessment & Plan:   Hemorrhoids, internal, with bleeding Persist Benefiber 2 tbsp/d Hi fiber ? reband  Unspecified constipation fiber  Cough cxr  GERD (gastroesophageal reflux disease) Change nexium to pantoprazole (cost)   Upper abdominal pain He had an EGD which is been unrevealing, actually to because he had bleeding from a biopsy site. Will check an upper, ultrasound. I think he has some sort of functional disturbance. Will consider buspirone.  Dyspepsia He had an EGD which is been unrevealing, actually to because he had bleeding from a biopsy site. Will check an upper, ultrasound. I think he has some sort of functional disturbance. Will consider buspirone.

## 2014-06-27 NOTE — Assessment & Plan Note (Signed)
He had an EGD which is been unrevealing, actually to because he had bleeding from a biopsy site. Will check an upper, ultrasound. I think he has some sort of functional disturbance. Will consider buspirone.

## 2014-06-27 NOTE — Assessment & Plan Note (Signed)
cxr 

## 2014-06-27 NOTE — Assessment & Plan Note (Addendum)
He had an EGD which is been unrevealing, actually to because he had bleeding from a biopsy site. Will check an upper, ultrasound. I think he has some sort of functional disturbance. Will consider buspirone.

## 2014-06-27 NOTE — Assessment & Plan Note (Signed)
Persist Benefiber 2 tbsp/d Hi fiber ? reband

## 2014-06-27 NOTE — Progress Notes (Signed)
Quick Note:  CXR is normal please notify ______

## 2014-06-27 NOTE — Patient Instructions (Addendum)
You have been scheduled for an abdominal ultrasound at [facility] (1st floor of hospital) on 06-30-2014 at 830 am. Please arrive 15 minutes prior to your appointment for registration. Make certain not to have anything to eat or drink 6 hours prior to your appointment. Should you need to reschedule your appointment, please contact radiology at [phone]. This test typically takes about 30 minutes to perform.  Please go to the basement before leaving today to have your chest x-ray done.  Please stop Nexium   We have sent the following medications to your pharmacy for you to pick up at your convenience: Pantoprazole 40 mg  Please purchase Benefiber over the counter and take one tablespoon mixed in water twice daily.  Information on reflux was given today for you to follow.  Information on a high fiber diet was given today for you to follow.   Thank you for the opportunity to treat you today.

## 2014-06-27 NOTE — Assessment & Plan Note (Addendum)
Change nexium to pantoprazole (cost)

## 2014-06-30 ENCOUNTER — Telehealth: Payer: Self-pay | Admitting: Internal Medicine

## 2014-06-30 ENCOUNTER — Ambulatory Visit (HOSPITAL_COMMUNITY)
Admission: RE | Admit: 2014-06-30 | Discharge: 2014-06-30 | Disposition: A | Discharge status: Home / Self Care | Payer: BC Managed Care – PPO | Source: Ambulatory Visit | Attending: Internal Medicine | Admitting: Internal Medicine

## 2014-06-30 DIAGNOSIS — R05 Cough: Secondary | ICD-10-CM | POA: Insufficient documentation

## 2014-06-30 DIAGNOSIS — R1011 Right upper quadrant pain: Secondary | ICD-10-CM | POA: Insufficient documentation

## 2014-06-30 DIAGNOSIS — K219 Gastro-esophageal reflux disease without esophagitis: Secondary | ICD-10-CM | POA: Diagnosis not present

## 2014-06-30 DIAGNOSIS — R1010 Upper abdominal pain, unspecified: Secondary | ICD-10-CM

## 2014-06-30 DIAGNOSIS — K3189 Other diseases of stomach and duodenum: Secondary | ICD-10-CM | POA: Diagnosis not present

## 2014-06-30 DIAGNOSIS — R059 Cough, unspecified: Secondary | ICD-10-CM | POA: Diagnosis not present

## 2014-06-30 DIAGNOSIS — R1013 Epigastric pain: Secondary | ICD-10-CM

## 2014-06-30 MED ORDER — PANTOPRAZOLE SODIUM 40 MG PO TBEC: 40.0000 mg | DELAYED_RELEASE_TABLET | Freq: Every day | ORAL | Status: DC

## 2014-06-30 NOTE — Telephone Encounter (Signed)
rx sent

## 2014-07-04 ENCOUNTER — Other Ambulatory Visit: Payer: Self-pay

## 2014-07-04 MED ORDER — GLYCOPYRROLATE 2 MG PO TABS: 2.0000 mg | ORAL_TABLET | Freq: Two times a day (BID) | ORAL | Status: DC

## 2014-07-04 NOTE — Progress Notes (Signed)
Quick Note:  [place] is ok All organs seen here are ok  Dc dicyclomine Take glycopyrrolate 2 mg bid # 60 5 RF See me if that does not work ______

## 2014-07-19 ENCOUNTER — Encounter: Payer: Self-pay | Admitting: Internal Medicine

## 2014-07-19 ENCOUNTER — Other Ambulatory Visit (INDEPENDENT_AMBULATORY_CARE_PROVIDER_SITE_OTHER): Payer: BC Managed Care – PPO

## 2014-07-19 ENCOUNTER — Ambulatory Visit (INDEPENDENT_AMBULATORY_CARE_PROVIDER_SITE_OTHER): Payer: BC Managed Care – PPO | Admitting: Internal Medicine

## 2014-07-19 ENCOUNTER — Telehealth: Payer: Self-pay | Admitting: Internal Medicine

## 2014-07-19 VITALS — BP 112/62 | HR 56 | Temp 98.6°F | Wt 123.2 lb

## 2014-07-19 DIAGNOSIS — Z0189 Encounter for other specified special examinations: Secondary | ICD-10-CM

## 2014-07-19 DIAGNOSIS — R7302 Impaired glucose tolerance (oral): Secondary | ICD-10-CM

## 2014-07-19 DIAGNOSIS — E785 Hyperlipidemia, unspecified: Secondary | ICD-10-CM

## 2014-07-19 DIAGNOSIS — Z0000 Encounter for general adult medical examination without abnormal findings: Secondary | ICD-10-CM

## 2014-07-19 DIAGNOSIS — E038 Other specified hypothyroidism: Secondary | ICD-10-CM

## 2014-07-19 DIAGNOSIS — N410 Acute prostatitis: Secondary | ICD-10-CM

## 2014-07-19 LAB — URINALYSIS, ROUTINE W REFLEX MICROSCOPIC
BILIRUBIN URINE: NEGATIVE
Hgb urine dipstick: NEGATIVE
KETONES UR: NEGATIVE
LEUKOCYTES UA: NEGATIVE
NITRITE: NEGATIVE
RBC / HPF: NONE SEEN (ref 0–?)
SPECIFIC GRAVITY, URINE: 1.01 (ref 1.000–1.030)
Total Protein, Urine: NEGATIVE
UROBILINOGEN UA: 0.2 (ref 0.0–1.0)
Urine Glucose: NEGATIVE
pH: 7.5 (ref 5.0–8.0)

## 2014-07-19 MED ORDER — ALLOPURINOL 300 MG PO TABS: 300.0000 mg | ORAL_TABLET | Freq: Every day | ORAL | Status: DC

## 2014-07-19 MED ORDER — HYOSCYAMINE SULFATE ER 0.375 MG PO TBCR: 1.0000 | EXTENDED_RELEASE_TABLET | Freq: Two times a day (BID) | ORAL | Status: DC

## 2014-07-19 MED ORDER — LEVOTHYROXINE SODIUM 75 MCG PO TABS: 75.0000 ug | ORAL_TABLET | Freq: Every day | ORAL | Status: DC

## 2014-07-19 MED ORDER — SIMVASTATIN 20 MG PO TABS: 20.0000 mg | ORAL_TABLET | Freq: Every day | ORAL | Status: DC

## 2014-07-19 MED ORDER — DOXYCYCLINE HYCLATE 100 MG PO TABS: 100.0000 mg | ORAL_TABLET | Freq: Two times a day (BID) | ORAL | Status: DC

## 2014-07-19 NOTE — Telephone Encounter (Signed)
Dr Carlean Purl what else can he try?

## 2014-07-19 NOTE — Assessment & Plan Note (Signed)
stable overall by history and exam, recent data reviewed with pt, and pt to continue medical treatment as before,  to f/u any worsening symptoms or concerns Lab Results  Component Value Date   TSH 4.25 01/18/2014

## 2014-07-19 NOTE — Telephone Encounter (Signed)
The pt is aware and rx has been sent, he will call with any further concerns or issues

## 2014-07-19 NOTE — Assessment & Plan Note (Signed)
stable overall by history and exam, recent data reviewed with pt, and pt to continue medical treatment as before,  to f/u any worsening symptoms or concerns Lab Results  Component Value Date   LDLCALC 55 01/18/2014

## 2014-07-19 NOTE — Assessment & Plan Note (Signed)
Presumed, declines DRE, for 1 mo trial antibx, but if symptoms not improved in 1 month, will need urology referral

## 2014-07-19 NOTE — Patient Instructions (Signed)
Please take all new medication as prescribed - the antibiotic, for Acute Prostatitis  Please see GI for the question of cost of the new medication from you last visit  Please continue all other medications as before, and refills have been done if requested.  Please have the pharmacy call with any other refills you may need.  Please keep your appointments with your specialists as you may have planned  Please go to the LAB in the Basement (turn left off the elevator) for the tests to be done today - just the urine testing today  You will be contacted by phone if any changes need to be made immediately.  Otherwise, you will receive a letter about your results with an explanation, but please check with MyChart first.  Please remember to sign up for MyChart if you have not done so, as this will be important to you in the future with finding out test results, communicating by private email, and scheduling acute appointments online when needed.  Please return in 6 months, or sooner if needed, with Lab testing done 3-5 days before

## 2014-07-19 NOTE — Telephone Encounter (Signed)
Hyoscyamine 0.375 mg bid # 60 5 RF

## 2014-07-19 NOTE — Progress Notes (Signed)
Subjective:    Patient ID: Samuel Meza, male    DOB: December 23, 1970, 43 y.o.   MRN: [id number]  HPI   Here to f/u with family interpretor  - C/o 3 mo onset mild urinary hesitancy and slower stream, ? Mild low mild pelvic discomfort as well, Denies urinary symptoms such as dysuria, frequency, urgency, flank pain, hematuria or n/v, fever, chills.  Has hx of prostatitis in past.  No hx of BPH, recent PSA normal for age x 6 mo ago  Otherwise,  Pt denies chest pain, increased sob or doe, wheezing, orthopnea, PND, increased LE swelling, palpitations, dizziness or syncope.  Pt denies polydipsia, polyuria, or low sugar symptoms such as weakness or confusion improved with po intake.  Pt denies new neurological symptoms such as new headache, or facial or extremity weakness or numbness.   Pt states overall good compliance with meds, has been trying to follow lower cholesterol diet, with wt overall stable.  Has seen GI., glycopyroll is expensive, plans to f/u with GI  Does c/o ongoing fatigue, but denies signficant daytime hypersomnolence, and Denies worsening depressive symptoms, suicidal ideation, or panic. Past Medical History  Diagnosis Date  . Hyperlipidemia   . Thyroid disease   . Gout   . Diabetes mellitus without complication   . Hemorrhoids, internal, with bleeding 07/20/2013  . GERD (gastroesophageal reflux disease) 03/01/2014   Past Surgical History  Procedure Laterality Date  . Esophagogastroduodenoscopy N/A 06/16/2013    Procedure: ESOPHAGOGASTRODUODENOSCOPY (EGD);  Surgeon: Milus Banister, MD;  Location: [place];  Service: Endoscopy;  Laterality: N/A;  . Colonoscopy  2014  . Hemorrhoid banding  2014    reports that he has never smoked. He has never used smokeless tobacco. He reports that he drinks alcohol. He reports that he does not use illicit drugs. family history includes Hyperlipidemia in his father, mother, and other; Hypertension in his father, mother, and other. There is no history of  Alcohol abuse, Cancer, Diabetes, Early death, Heart disease, Kidney disease, or Stroke. No Known Allergies Current Outpatient Prescriptions on File Prior to Visit  Medication Sig Dispense Refill  . glycopyrrolate (ROBINUL) 2 MG tablet Take 1 tablet (2 mg total) by mouth 2 (two) times daily.  60 tablet  5  . pantoprazole (PROTONIX) 40 MG tablet Take 1 tablet (40 mg total) by mouth daily.  90 tablet  3   No current facility-administered medications on file prior to visit.   Review of Systems  Constitutional: Negative for unusual diaphoresis or other sweats  HENT: Negative for ringing in ear Eyes: Negative for double vision or worsening visual disturbance.  Respiratory: Negative for choking and stridor.   Gastrointestinal: Negative for vomiting or other signifcant bowel change Genitourinary: Negative for hematuria or decreased urine volume.  Musculoskeletal: Negative for other MSK pain or swelling Skin: Negative for color change and worsening wound.  Neurological: Negative for tremors and numbness other than noted  Psychiatric/Behavioral: Negative for decreased concentration or agitation other than above       Objective:   Physical Exam BP 112/62  Pulse 56  Temp(Src) 98.6 F (37 C) (Oral)  Wt 123 lb 4 oz (55.906 kg)  SpO2 99% VS noted,  Constitutional: Pt appears well-developed, well-nourished.  HENT: Head: NCAT.  Right Ear: External ear normal.  Left Ear: External ear normal.  Eyes: . Pupils are equal, round, and reactive to light. Conjunctivae and EOM are normal Neck: Normal range of motion. Neck supple.  Cardiovascular: Normal rate and  regular rhythm.   Pulmonary/Chest: Effort normal and breath sounds normal.  Abd:  Soft, NT, ND, + BS Neurological: Pt is alert. Not confused , motor grossly intact Skin: Skin is warm. No rash Psychiatric: Pt behavior is normal. No agitation. mild nervous today    Assessment & Plan:

## 2014-07-19 NOTE — Assessment & Plan Note (Signed)
stable overall by history and exam, recent data reviewed with pt, and pt to continue medical treatment as before,  to f/u any worsening symptoms or concerns Lab Results  Component Value Date   HGBA1C 6.1 01/18/2014

## 2014-07-19 NOTE — Progress Notes (Signed)
Pre visit review using our clinic review tool, if applicable. No additional management support is needed unless otherwise documented below in the visit note. 

## 2014-09-14 ENCOUNTER — Telehealth: Payer: Self-pay | Admitting: Internal Medicine

## 2014-09-14 NOTE — Telephone Encounter (Signed)
Please advise 

## 2014-09-14 NOTE — Telephone Encounter (Signed)
Pt can make own appt if for routine, unless required by his insurance, or has a specific reason (which I would need to know) thanks

## 2014-09-14 NOTE — Telephone Encounter (Signed)
Pt son called in and wanted to see if Dr Jenny Reichmann could put in a  Referral for a eye Dr ?

## 2014-09-15 ENCOUNTER — Other Ambulatory Visit: Payer: Self-pay | Admitting: Internal Medicine

## 2014-09-15 DIAGNOSIS — H538 Other visual disturbances: Secondary | ICD-10-CM

## 2014-09-15 NOTE — Telephone Encounter (Signed)
Called pt no answer LMOM with md response.../lmb 

## 2014-10-11 ENCOUNTER — Encounter: Payer: Self-pay | Admitting: Internal Medicine

## 2015-01-17 ENCOUNTER — Other Ambulatory Visit (INDEPENDENT_AMBULATORY_CARE_PROVIDER_SITE_OTHER): Payer: 59

## 2015-01-17 DIAGNOSIS — Z0189 Encounter for other specified special examinations: Secondary | ICD-10-CM | POA: Diagnosis not present

## 2015-01-17 DIAGNOSIS — Z0000 Encounter for general adult medical examination without abnormal findings: Secondary | ICD-10-CM

## 2015-01-17 DIAGNOSIS — R7302 Impaired glucose tolerance (oral): Secondary | ICD-10-CM

## 2015-01-17 LAB — CBC WITH DIFFERENTIAL/PLATELET
BASOS PCT: 0.5 % (ref 0.0–3.0)
Basophils Absolute: 0 10*3/uL (ref 0.0–0.1)
Eosinophils Absolute: 0.1 10*3/uL (ref 0.0–0.7)
Eosinophils Relative: 1 % (ref 0.0–5.0)
HCT: 44.3 % (ref 39.0–52.0)
HEMOGLOBIN: 14.8 g/dL (ref 13.0–17.0)
LYMPHS PCT: 28.7 % (ref 12.0–46.0)
Lymphs Abs: 2.4 10*3/uL (ref 0.7–4.0)
MCHC: 33.3 g/dL (ref 30.0–36.0)
MCV: 86.7 fl (ref 78.0–100.0)
Monocytes Absolute: 0.5 10*3/uL (ref 0.1–1.0)
Monocytes Relative: 6.1 % (ref 3.0–12.0)
Neutro Abs: 5.3 10*3/uL (ref 1.4–7.7)
Neutrophils Relative %: 63.7 % (ref 43.0–77.0)
Platelets: 265 10*3/uL (ref 150.0–400.0)
RBC: 5.11 Mil/uL (ref 4.22–5.81)
RDW: 13.6 % (ref 11.5–15.5)
WBC: 8.4 10*3/uL (ref 4.0–10.5)

## 2015-01-17 LAB — URINALYSIS, ROUTINE W REFLEX MICROSCOPIC
Bilirubin Urine: NEGATIVE
Hgb urine dipstick: NEGATIVE
Ketones, ur: NEGATIVE
LEUKOCYTES UA: NEGATIVE
NITRITE: NEGATIVE
PH: 5.5 (ref 5.0–8.0)
Specific Gravity, Urine: 1.015 (ref 1.000–1.030)
Total Protein, Urine: NEGATIVE
UROBILINOGEN UA: 0.2 (ref 0.0–1.0)
Urine Glucose: NEGATIVE

## 2015-01-17 LAB — HEPATIC FUNCTION PANEL
ALT: 27 U/L (ref 0–53)
AST: 26 U/L (ref 0–37)
Albumin: 4.4 g/dL (ref 3.5–5.2)
Alkaline Phosphatase: 36 U/L — ABNORMAL LOW (ref 39–117)
BILIRUBIN DIRECT: 0.1 mg/dL (ref 0.0–0.3)
BILIRUBIN TOTAL: 0.4 mg/dL (ref 0.2–1.2)
Total Protein: 7.5 g/dL (ref 6.0–8.3)

## 2015-01-17 LAB — LIPID PANEL
CHOL/HDL RATIO: 3
Cholesterol: 144 mg/dL (ref 0–200)
HDL: 44.2 mg/dL (ref >39.00)
LDL Cholesterol: 76 mg/dL (ref 0–99)
NonHDL: 99.8
TRIGLYCERIDES: 119 mg/dL (ref 0.0–149.0)
VLDL: 23.8 mg/dL (ref 0.0–40.0)

## 2015-01-17 LAB — BASIC METABOLIC PANEL
BUN: 19 mg/dL (ref 6–23)
CHLORIDE: 101 meq/L (ref 96–112)
CO2: 32 mEq/L (ref 19–32)
Calcium: 9.9 mg/dL (ref 8.4–10.5)
Creatinine, Ser: 0.92 mg/dL (ref 0.40–1.50)
GFR: 95.5 mL/min (ref >60.00)
Glucose, Bld: 98 mg/dL (ref 70–99)
POTASSIUM: 4.3 meq/L (ref 3.5–5.1)
Sodium: 138 mEq/L (ref 135–145)

## 2015-01-17 LAB — HEMOGLOBIN A1C: Hgb A1c MFr Bld: 5.7 % (ref 4.6–6.5)

## 2015-01-17 LAB — TSH: TSH: 9.57 u[IU]/mL — ABNORMAL HIGH (ref 0.35–4.50)

## 2015-01-17 LAB — PSA: PSA: 1.19 ng/mL (ref 0.10–4.00)

## 2015-01-24 ENCOUNTER — Encounter: Payer: Self-pay | Admitting: Internal Medicine

## 2015-01-24 ENCOUNTER — Ambulatory Visit (INDEPENDENT_AMBULATORY_CARE_PROVIDER_SITE_OTHER): Payer: 59 | Admitting: Internal Medicine

## 2015-01-24 VITALS — BP 112/74 | HR 56 | Temp 97.7°F | Resp 18 | Wt 123.1 lb

## 2015-01-24 DIAGNOSIS — M79671 Pain in right foot: Secondary | ICD-10-CM | POA: Diagnosis not present

## 2015-01-24 DIAGNOSIS — E785 Hyperlipidemia, unspecified: Secondary | ICD-10-CM | POA: Diagnosis not present

## 2015-01-24 DIAGNOSIS — E039 Hypothyroidism, unspecified: Secondary | ICD-10-CM | POA: Diagnosis not present

## 2015-01-24 DIAGNOSIS — Z0000 Encounter for general adult medical examination without abnormal findings: Secondary | ICD-10-CM | POA: Diagnosis not present

## 2015-01-24 DIAGNOSIS — M79672 Pain in left foot: Secondary | ICD-10-CM

## 2015-01-24 MED ORDER — LEVOTHYROXINE SODIUM 88 MCG PO TABS: 88.0000 ug | ORAL_TABLET | Freq: Every day | ORAL | Status: DC

## 2015-01-24 NOTE — Progress Notes (Signed)
Pre visit review using our clinic review tool, if applicable. No additional management support is needed unless otherwise documented below in the visit note. 

## 2015-01-24 NOTE — Assessment & Plan Note (Signed)
Mild uncontrolled, for incr levothyroxin to 88 mcg qd

## 2015-01-24 NOTE — Assessment & Plan Note (Signed)

## 2015-01-24 NOTE — Assessment & Plan Note (Signed)
stable overall by history and exam, and pt to continue medical treatment as before,  to f/u any worsening symptoms or concerns 

## 2015-01-24 NOTE — Patient Instructions (Addendum)
OK to increase the levothyroxine to 88 mcg per day  Please continue all other medications as before, and refills have been done if requested.  Please have the pharmacy call with any other refills you may need.  Please continue your efforts at being more active, low cholesterol diet, and weight control.  You are otherwise up to date with prevention measures today.  Please consider seeing Dr Smith/sports medicine if the heels continue to have pain  Please keep your appointments with your specialists as you may have planned  Please return in 1 year for your yearly visit, or sooner if needed, with Lab testing done 3-5 days before

## 2015-01-24 NOTE — Assessment & Plan Note (Signed)
C/w bilat right > left achilles tendonitis, for stretching excercises, tylenol prn, avoid nsaids due to 9/14 duodenal GI bleed, consider see Dr Smith/sport med for further pain

## 2015-01-24 NOTE — Progress Notes (Signed)
Subjective:    Patient ID: Samuel Meza, male    DOB: 18-Mar-1971, 44 y.o.   MRN: [id number]  HPI  Here for wellness and f/u with friend to interpret;  Overall doing ok;  Pt denies Chest pain, worsening SOB, DOE, wheezing, orthopnea, PND, worsening LE edema, palpitations, dizziness or syncope.  Pt denies neurological change such as new headache, facial or extremity weakness.  Pt denies polydipsia, polyuria, or low sugar symptoms. Pt states overall good compliance with treatment and medications, good tolerability, and has been trying to follow appropriate diet.  Pt denies worsening depressive symptoms, suicidal ideation or panic. No fever, night sweats, wt loss, loss of appetite, or other constitutional symptoms.  Pt states good ability with ADL's, has low fall risk, home safety reviewed and adequate, no other significant changes in hearing or vision, and only occasionally active with exercise. Does have bilat heel pain with running 10 min per day for the past yr, only worse in the past 2 wks, wearing same shoes, uneven surfaces, no falls or injury. Denies hyper or hypo thyroid symptoms such as voice, skin or hair change.  BP Readings from Last 3 Encounters:  01/24/15 112/74  07/19/14 112/62  06/27/14 98/68   Wt Readings from Last 3 Encounters:  01/24/15 123 lb 1.3 oz (55.829 kg)  07/19/14 123 lb 4 oz (55.906 kg)  06/27/14 124 lb (56.246 kg)    Past Medical History  Diagnosis Date  . Hyperlipidemia   . Thyroid disease   . Gout   . Diabetes mellitus without complication   . Hemorrhoids, internal, with bleeding 07/20/2013  . GERD (gastroesophageal reflux disease) 03/01/2014   Past Surgical History  Procedure Laterality Date  . Esophagogastroduodenoscopy N/A 06/16/2013    Procedure: ESOPHAGOGASTRODUODENOSCOPY (EGD);  Surgeon: Milus Banister, MD;  Location: [place];  Service: Endoscopy;  Laterality: N/A;  . Colonoscopy  2014  . Hemorrhoid banding  2014    reports that he has never  smoked. He has never used smokeless tobacco. He reports that he drinks alcohol. He reports that he does not use illicit drugs. family history includes Hyperlipidemia in his father, mother, and other; Hypertension in his father, mother, and other. There is no history of Alcohol abuse, Cancer, Diabetes, Early death, Heart disease, Kidney disease, or Stroke. No Known Allergies Current Outpatient Prescriptions on File Prior to Visit  Medication Sig Dispense Refill  . allopurinol (ZYLOPRIM) 300 MG tablet Take 1 tablet (300 mg total) by mouth daily. 90 tablet 3  . doxycycline (VIBRA-TABS) 100 MG tablet Take 1 tablet (100 mg total) by mouth 2 (two) times daily. 60 tablet 0  . glycopyrrolate (ROBINUL) 2 MG tablet Take 1 tablet (2 mg total) by mouth 2 (two) times daily. 60 tablet 5  . Hyoscyamine Sulfate 0.375 MG TBCR Take 1 tablet (0.375 mg total) by mouth 2 (two) times daily. 60 tablet 5  . Multiple Vitamin (MULTIVITAMIN) tablet Take 1 tablet by mouth daily.    . pantoprazole (PROTONIX) 40 MG tablet Take 1 tablet (40 mg total) by mouth daily. 90 tablet 3  . simvastatin (ZOCOR) 20 MG tablet Take 1 tablet (20 mg total) by mouth daily. 90 tablet 3   No current facility-administered medications on file prior to visit.   Review of Systems Constitutional: Negative for increased diaphoresis, other activity, appetite or siginficant weight change other than noted HENT: Negative for worsening hearing loss, ear pain, facial swelling, mouth sores and neck stiffness.   Eyes: Negative for other  worsening pain, redness or visual disturbance.  Respiratory: Negative for shortness of breath and wheezing  Cardiovascular: Negative for chest pain and palpitations.  Gastrointestinal: Negative for diarrhea, blood in stool, abdominal distention or other pain Genitourinary: Negative for hematuria, flank pain or change in urine volume.  Musculoskeletal: Negative for myalgias or other joint complaints.  Skin: Negative for  color change and wound or drainage.  Neurological: Negative for syncope and numbness. other than noted Hematological: Negative for adenopathy. or other swelling Psychiatric/Behavioral: Negative for hallucinations, SI, self-injury, decreased concentration or other worsening agitation.     Objective:   Physical Exam BP 112/74 mmHg  Pulse 56  Temp(Src) 97.7 F (36.5 C) (Oral)  Resp 18  Wt 123 lb 1.3 oz (55.829 kg)  SpO2 97% VS noted,  Constitutional: Pt is oriented to person, place, and time. Appears well-developed and well-nourished, in no significant distress Head: Normocephalic and atraumatic.  Right Ear: External ear normal.  Left Ear: External ear normal.  Nose: Nose normal.  Mouth/Throat: Oropharynx is clear and moist.  Eyes: Conjunctivae and EOM are normal. Pupils are equal, round, and reactive to light.  Neck: Normal range of motion. Neck supple. No JVD present. No tracheal deviation present or significant neck LA or mass Cardiovascular: Normal rate, regular rhythm, normal heart sounds and intact distal pulses.   Pulmonary/Chest: Effort normal and breath sounds without rales or wheezing  Abdominal: Soft. Bowel sounds are normal. NT. No HSM  Musculoskeletal: Normal range of motion. Exhibits no edema.  Lymphadenopathy:  Has no cervical adenopathy.  Neurological: Pt is alert and oriented to person, place, and time. Pt has normal reflexes. No cranial nerve deficit. Motor grossly intact Skin: Skin is warm and dry. No rash noted.  Psychiatric:  Has normal mood and affect. Behavior is normal.   Lab Results  Component Value Date   WBC 8.4 01/17/2015   HGB 14.8 01/17/2015   HCT 44.3 01/17/2015   PLT 265.0 01/17/2015   GLUCOSE 98 01/17/2015   CHOL 144 01/17/2015   TRIG 119.0 01/17/2015   HDL 44.20 01/17/2015   LDLCALC 76 01/17/2015   ALT 27 01/17/2015   AST 26 01/17/2015   NA 138 01/17/2015   K 4.3 01/17/2015   CL 101 01/17/2015   CREATININE 0.92 01/17/2015   BUN 19  01/17/2015   CO2 32 01/17/2015   TSH 9.57* 01/17/2015   PSA 1.19 01/17/2015   INR 1.1* 08/30/2013   HGBA1C 5.7 01/17/2015       Assessment & Plan:

## 2015-03-28 ENCOUNTER — Ambulatory Visit (INDEPENDENT_AMBULATORY_CARE_PROVIDER_SITE_OTHER): Payer: 59 | Admitting: Internal Medicine

## 2015-03-28 ENCOUNTER — Other Ambulatory Visit (INDEPENDENT_AMBULATORY_CARE_PROVIDER_SITE_OTHER): Payer: 59

## 2015-03-28 ENCOUNTER — Encounter: Payer: Self-pay | Admitting: Internal Medicine

## 2015-03-28 VITALS — BP 110/70 | HR 76 | Temp 97.8°F | Ht 64.0 in | Wt 120.0 lb

## 2015-03-28 DIAGNOSIS — M5412 Radiculopathy, cervical region: Secondary | ICD-10-CM | POA: Insufficient documentation

## 2015-03-28 DIAGNOSIS — E039 Hypothyroidism, unspecified: Secondary | ICD-10-CM

## 2015-03-28 DIAGNOSIS — R7302 Impaired glucose tolerance (oral): Secondary | ICD-10-CM | POA: Diagnosis not present

## 2015-03-28 LAB — TSH: TSH: 0.92 u[IU]/mL (ref 0.35–4.50)

## 2015-03-28 MED ORDER — CYCLOBENZAPRINE HCL 5 MG PO TABS: 5.0000 mg | ORAL_TABLET | Freq: Three times a day (TID) | ORAL | Status: DC | PRN

## 2015-03-28 MED ORDER — PREDNISONE 10 MG PO TABS: ORAL_TABLET | ORAL | Status: DC

## 2015-03-28 NOTE — Assessment & Plan Note (Signed)
Minor, stable overall, to call for onset of polys with steroid tx

## 2015-03-28 NOTE — Patient Instructions (Addendum)
Please take all new medication as prescribed  - the muscle relaxer and prednisone  You will be contacted regarding the referral for: MRI, and orthopedic referrals  Please go to the LAB in the Basement (turn left off the elevator) for the tests to be done today  You will be contacted by phone if any changes need to be made immediately.  Otherwise, you will receive a letter about your results with an explanation, but please check with MyChart first.  Please remember to sign up for MyChart if you have not done so, as this will be important to you in the future with finding out test results, communicating by private email, and scheduling acute appointments online when needed.

## 2015-03-28 NOTE — Progress Notes (Signed)
Pre visit review using our clinic review tool, if applicable. No additional management support is needed unless otherwise documented below in the visit note. 

## 2015-03-28 NOTE — Assessment & Plan Note (Addendum)
Mild to mod with neuro change, for MRI c-spine, trial flexeril and predpac asd, and refer ortho, cant r/o bilat CTS but suspect more cervical spine related

## 2015-03-28 NOTE — Progress Notes (Signed)
Subjective:    Patient ID: Samuel Meza, male    DOB: [birth year], 44 y.o.   MRN: [id number]  HPI  Here to f/u with family as interpretor,m with 2 mo worsening post neck pain, points to both high cervical with radiation to the bilat occiputs, but also lowest cervical assoc with bilat pain, numbness and most recently mild decrsaed grip strength, most noticeable with using eating utensils.  No prior hx of neck issues, or surgury or recent imaging.  Denies hyper or hypo thyroid symptoms such as voice, skin or hair change, due for f/u TSH as well after recent increased thyroid replacement tx.  Pt denies chest pain, increased sob or doe, wheezing, orthopnea, PND, increased LE swelling, palpitations, dizziness or syncope.  Pt denies polydipsia, polyuria,  Past Medical History  Diagnosis Date  . Hyperlipidemia   . Thyroid disease   . Gout   . Diabetes mellitus without complication   . Hemorrhoids, internal, with bleeding 07/20/2013  . GERD (gastroesophageal reflux disease) 03/01/2014   Past Surgical History  Procedure Laterality Date  . Esophagogastroduodenoscopy N/A 06/16/2013    Procedure: ESOPHAGOGASTRODUODENOSCOPY (EGD);  Surgeon: Milus Banister, MD;  Location: [facility];  Service: Endoscopy;  Laterality: N/A;  . Colonoscopy  2014  . Hemorrhoid banding  2014    reports that he has never smoked. He has never used smokeless tobacco. He reports that he drinks alcohol. He reports that he does not use illicit drugs. family history includes Hyperlipidemia in his father, mother, and other; Hypertension in his father, mother, and other. There is no history of Alcohol abuse, Cancer, Diabetes, Early death, Heart disease, Kidney disease, or Stroke. No Known Allergies Current Outpatient Prescriptions on File Prior to Visit  Medication Sig Dispense Refill  . allopurinol (ZYLOPRIM) 300 MG tablet Take 1 tablet (300 mg total) by mouth daily. 90 tablet 3  . glycopyrrolate (ROBINUL) 2 MG tablet Take 1 tablet (2  mg total) by mouth 2 (two) times daily. 60 tablet 5  . Hyoscyamine Sulfate 0.375 MG TBCR Take 1 tablet (0.375 mg total) by mouth 2 (two) times daily. 60 tablet 5  . levothyroxine (SYNTHROID, LEVOTHROID) 88 MCG tablet Take 1 tablet (88 mcg total) by mouth daily. 90 tablet 3  . Multiple Vitamin (MULTIVITAMIN) tablet Take 1 tablet by mouth daily.    . pantoprazole (PROTONIX) 40 MG tablet Take 1 tablet (40 mg total) by mouth daily. 90 tablet 3  . simvastatin (ZOCOR) 20 MG tablet Take 1 tablet (20 mg total) by mouth daily. 90 tablet 3  . doxycycline (VIBRA-TABS) 100 MG tablet Take 1 tablet (100 mg total) by mouth 2 (two) times daily. (Patient not taking: Reported on 03/28/2015) 60 tablet 0   No current facility-administered medications on file prior to visit.     Review of Systems  Constitutional: Negative for unusual diaphoresis or night sweats HENT: Negative for ringing in ear or discharge Eyes: Negative for double vision or worsening visual disturbance.  Respiratory: Negative for choking and stridor.   Gastrointestinal: Negative for vomiting or other signifcant bowel change Genitourinary: Negative for hematuria or change in urine volume.  Musculoskeletal: Negative for other MSK pain or swelling Skin: Negative for color change and worsening wound.  Neurological: Negative for tremors and numbness other than noted  Psychiatric/Behavioral: Negative for decreased concentration or agitation other than above       Objective:   Physical Exam BP 110/70 mmHg  Pulse 76  Temp(Src) 97.8 F (36.6 C) (Oral)  Ht 5\' 4"  (1.626 m)  Wt 120 lb (54.432 kg)  BMI 20.59 kg/m2  SpO2 98% VS noted,  Constitutional: Pt appears in no significant distress HENT: Head: NCAT.  Right Ear: External ear normal.  Left Ear: External ear normal.  Mild tender noted lowest cervical without swelling, rash Eyes: . Pupils are equal, round, and reactive to light. Conjunctivae and EOM are normal Neck: Normal range of  motion. Neck supple.  Cardiovascular: Normal rate and regular rhythm.   Pulmonary/Chest: Effort normal and breath sounds without rales or wheezing.  Abd:  Soft, NT, ND, + BS Neurological: Pt is alert. Not confused , motor grossly intact except for mild decrsaed bilat grip strength Skin: Skin is warm. No rash, no LE edema Psychiatric: Pt behavior is normal. No agitation.      Assessment & Plan:

## 2015-03-28 NOTE — Assessment & Plan Note (Signed)
With recent increase replacement tx, for f/u tsh

## 2015-03-29 ENCOUNTER — Encounter: Payer: Self-pay | Admitting: Internal Medicine

## 2015-03-29 ENCOUNTER — Telehealth: Payer: Self-pay | Admitting: Internal Medicine

## 2015-03-29 NOTE — Telephone Encounter (Signed)
Sorry, not able to do this at this time, as MRI is not normally done for this reason alone

## 2015-03-29 NOTE — Telephone Encounter (Signed)
Patients sister called regarding patients appointment yesterday. He was referred to have an MRI on his spine and he's wondering if he should has his head scanned since he has been having really bad headaches. Please call his sister [name] back at [phone]

## 2015-03-30 NOTE — Telephone Encounter (Signed)
Had an OV a few days ago. Headache with pain in neck, shoulder and arm. Referral was completed at [place].

## 2015-05-06 ENCOUNTER — Ambulatory Visit
Admission: RE | Admit: 2015-05-06 | Discharge: 2015-05-06 | Disposition: A | Discharge status: Home / Self Care | Payer: 59 | Source: Ambulatory Visit | Attending: Internal Medicine | Admitting: Internal Medicine

## 2015-05-06 DIAGNOSIS — M5412 Radiculopathy, cervical region: Secondary | ICD-10-CM

## 2015-05-08 ENCOUNTER — Other Ambulatory Visit: Payer: 59

## 2015-05-10 ENCOUNTER — Encounter: Payer: Self-pay | Admitting: Internal Medicine

## 2015-06-21 ENCOUNTER — Other Ambulatory Visit: Payer: Self-pay | Admitting: Internal Medicine

## 2015-07-11 ENCOUNTER — Encounter: Payer: Self-pay | Admitting: Internal Medicine

## 2015-07-11 ENCOUNTER — Other Ambulatory Visit (INDEPENDENT_AMBULATORY_CARE_PROVIDER_SITE_OTHER): Payer: 59

## 2015-07-11 ENCOUNTER — Ambulatory Visit (INDEPENDENT_AMBULATORY_CARE_PROVIDER_SITE_OTHER): Payer: 59 | Admitting: Internal Medicine

## 2015-07-11 VITALS — BP 108/70 | HR 65 | Temp 97.6°F | Ht 64.0 in | Wt 127.0 lb

## 2015-07-11 DIAGNOSIS — E039 Hypothyroidism, unspecified: Secondary | ICD-10-CM

## 2015-07-11 DIAGNOSIS — Z0189 Encounter for other specified special examinations: Secondary | ICD-10-CM | POA: Diagnosis not present

## 2015-07-11 DIAGNOSIS — Z0000 Encounter for general adult medical examination without abnormal findings: Secondary | ICD-10-CM

## 2015-07-11 DIAGNOSIS — M79643 Pain in unspecified hand: Secondary | ICD-10-CM | POA: Diagnosis not present

## 2015-07-11 DIAGNOSIS — M778 Other enthesopathies, not elsewhere classified: Secondary | ICD-10-CM | POA: Insufficient documentation

## 2015-07-11 DIAGNOSIS — M109 Gout, unspecified: Secondary | ICD-10-CM

## 2015-07-11 DIAGNOSIS — M542 Cervicalgia: Secondary | ICD-10-CM | POA: Insufficient documentation

## 2015-07-11 DIAGNOSIS — Z23 Encounter for immunization: Secondary | ICD-10-CM | POA: Diagnosis not present

## 2015-07-11 LAB — CBC WITH DIFFERENTIAL/PLATELET
BASOS ABS: 0 10*3/uL (ref 0.0–0.1)
Basophils Relative: 0.4 % (ref 0.0–3.0)
EOS ABS: 0.1 10*3/uL (ref 0.0–0.7)
Eosinophils Relative: 1.1 % (ref 0.0–5.0)
HCT: 47.2 % (ref 39.0–52.0)
Hemoglobin: 15.5 g/dL (ref 13.0–17.0)
LYMPHS ABS: 2.3 10*3/uL (ref 0.7–4.0)
Lymphocytes Relative: 27.4 % (ref 12.0–46.0)
MCHC: 32.9 g/dL (ref 30.0–36.0)
MCV: 87.5 fl (ref 78.0–100.0)
MONO ABS: 0.5 10*3/uL (ref 0.1–1.0)
Monocytes Relative: 6.3 % (ref 3.0–12.0)
NEUTROS ABS: 5.4 10*3/uL (ref 1.4–7.7)
Neutrophils Relative %: 64.8 % (ref 43.0–77.0)
PLATELETS: 258 10*3/uL (ref 150.0–400.0)
RBC: 5.39 Mil/uL (ref 4.22–5.81)
RDW: 13.1 % (ref 11.5–15.5)
WBC: 8.3 10*3/uL (ref 4.0–10.5)

## 2015-07-11 LAB — HEPATIC FUNCTION PANEL
ALBUMIN: 4.8 g/dL (ref 3.5–5.2)
ALK PHOS: 31 U/L — AB (ref 39–117)
ALT: 23 U/L (ref 0–53)
AST: 24 U/L (ref 0–37)
BILIRUBIN DIRECT: 0.1 mg/dL (ref 0.0–0.3)
TOTAL PROTEIN: 7.8 g/dL (ref 6.0–8.3)
Total Bilirubin: 0.6 mg/dL (ref 0.2–1.2)

## 2015-07-11 LAB — URINALYSIS, ROUTINE W REFLEX MICROSCOPIC
Bilirubin Urine: NEGATIVE
Hgb urine dipstick: NEGATIVE
KETONES UR: NEGATIVE
LEUKOCYTES UA: NEGATIVE
Nitrite: NEGATIVE
RBC / HPF: NONE SEEN (ref 0–?)
Specific Gravity, Urine: 1.01 (ref 1.000–1.030)
TOTAL PROTEIN, URINE-UPE24: NEGATIVE
Urine Glucose: NEGATIVE
Urobilinogen, UA: 0.2 (ref 0.0–1.0)
WBC, UA: NONE SEEN (ref 0–?)
pH: 6.5 (ref 5.0–8.0)

## 2015-07-11 LAB — BASIC METABOLIC PANEL
BUN: 17 mg/dL (ref 6–23)
CHLORIDE: 101 meq/L (ref 96–112)
CO2: 32 meq/L (ref 19–32)
Calcium: 10.1 mg/dL (ref 8.4–10.5)
Creatinine, Ser: 0.9 mg/dL (ref 0.40–1.50)
GFR: 97.73 mL/min (ref >60.00)
GLUCOSE: 90 mg/dL (ref 70–99)
POTASSIUM: 4.6 meq/L (ref 3.5–5.1)
SODIUM: 142 meq/L (ref 135–145)

## 2015-07-11 LAB — LIPID PANEL
CHOLESTEROL: 131 mg/dL (ref 0–200)
HDL: 47.5 mg/dL (ref >39.00)
LDL Cholesterol: 66 mg/dL (ref 0–99)
NONHDL: 83.35
Total CHOL/HDL Ratio: 3
Triglycerides: 89 mg/dL (ref 0.0–149.0)
VLDL: 17.8 mg/dL (ref 0.0–40.0)

## 2015-07-11 LAB — RHEUMATOID FACTOR: Rhuematoid fact SerPl-aCnc: <10 IU/mL (ref <=14)

## 2015-07-11 LAB — HIGH SENSITIVITY CRP: CRP, High Sensitivity: 0.25 mg/L (ref 0.000–5.000)

## 2015-07-11 LAB — PSA: PSA: 1 ng/mL (ref 0.10–4.00)

## 2015-07-11 LAB — TSH: TSH: 2.94 u[IU]/mL (ref 0.35–4.50)

## 2015-07-11 LAB — SEDIMENTATION RATE: SED RATE: 5 mm/h (ref 0–22)

## 2015-07-11 LAB — URIC ACID: URIC ACID, SERUM: 5.1 mg/dL (ref 4.0–7.8)

## 2015-07-11 MED ORDER — DICLOFENAC SODIUM 1 % TD GEL: 4.0000 g | Freq: Four times a day (QID) | TRANSDERMAL | Status: DC | PRN

## 2015-07-11 MED ORDER — NAPROXEN 500 MG PO TABS: 500.0000 mg | ORAL_TABLET | Freq: Two times a day (BID) | ORAL | Status: DC | PRN

## 2015-07-11 NOTE — Progress Notes (Signed)
Subjective:    Patient ID: Samuel Meza, male    DOB: 08/16/1971, 44 y.o.   MRN: [id number]  HPI  Here with brother for interpretor; c/o bilat hand pain primarily at all of the PIP's bilat with occas soreness and swelling, but also CMC of bilat thumbs with pain on the right radiating up the wrist along the first dorsal compartment with heat.  No trauma, fever, Has hx of gout, good compliance with allopurinol.  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  Wondering if needs MRI brain as well.  Pt denies new neurological symptoms such as new headache, or facial or extremity weakness or numbness  Denies hyper or hypo thyroid symptoms such as voice, skin or hair change. Past Medical History  Diagnosis Date  . Hyperlipidemia   . Thyroid disease   . Gout   . Diabetes mellitus without complication ([place])   . Hemorrhoids, internal, with bleeding 07/20/2013  . GERD (gastroesophageal reflux disease) 03/01/2014   Past Surgical History  Procedure Laterality Date  . Esophagogastroduodenoscopy N/A 06/16/2013    Procedure: ESOPHAGOGASTRODUODENOSCOPY (EGD);  Surgeon: Milus Banister, MD;  Location: [facility];  Service: Endoscopy;  Laterality: N/A;  . Colonoscopy  2014  . Hemorrhoid banding  2014    reports that he has never smoked. He has never used smokeless tobacco. He reports that he drinks alcohol. He reports that he does not use illicit drugs. family history includes Hyperlipidemia in his father, mother, and other; Hypertension in his father, mother, and other. There is no history of Alcohol abuse, Cancer, Diabetes, Early death, Heart disease, Kidney disease, or Stroke. No Known Allergies Current Outpatient Prescriptions on File Prior to Visit  Medication Sig Dispense Refill  . allopurinol (ZYLOPRIM)  300 MG tablet Take 1 tablet (300 mg total) by mouth daily. 90 tablet 3  . levothyroxine (SYNTHROID, LEVOTHROID) 88 MCG tablet Take 1 tablet (88 mcg total) by mouth daily. 90 tablet 3  . pantoprazole (PROTONIX) 40 MG tablet TAKE ONE TABLET BY MOUTH ONCE DAILY 90 tablet 0  . simvastatin (ZOCOR) 20 MG tablet Take 1 tablet (20 mg total) by mouth daily. 90 tablet 3  . Multiple Vitamin (MULTIVITAMIN) tablet Take 1 tablet by mouth daily.     No current facility-administered medications on file prior to visit.   Review of Systems  Constitutional: Negative for unusual diaphoresis or night sweats HENT: Negative for ringing in ear or discharge Eyes: Negative for double vision or worsening visual disturbance.  Respiratory: Negative for choking and stridor.   Gastrointestinal: Negative for vomiting or other signifcant bowel change Genitourinary: Negative for hematuria or change in urine volume.  Musculoskeletal: Negative for other MSK pain or swelling Skin: Negative for color change and worsening wound.  Neurological: Negative for tremors and numbness other than noted  Psychiatric/Behavioral: Negative for decreased concentration or agitation other than above       Objective:   Physical Exam BP 108/70 mmHg  Pulse 65  Temp(Src) 97.6 F (36.4 C) (Other (Comment))  Ht 5\' 4"  (1.626 m)  Wt 127 lb (57.607 kg)  BMI 21.79 kg/m2  SpO2 98% VS noted,  Constitutional: Pt appears in no significant distress HENT: Head: NCAT.  Right Ear: External ear normal.  Left Ear: External ear normal.  Eyes: . Pupils  are equal, round, and reactive to light. Conjunctivae and EOM are normal Neck: Normal range of motion. Neck supple.  Cardiovascular: Normal rate and regular rhythm.   Pulmonary/Chest: Effort normal and breath sounds without rales or wheezing.  Bilat hand PIP's with mild OA changes degenerative, no effusions, + FROM throughout Bilat knees without effusion, FROM, NT Cervical spine NT, FROM, no rash or  swelling Right wrist with wamrth/tender along first dorsal compartment Neurological: Pt is alert. Not confused , motor 5/5 intact, gait intact, cn 2-12 intact Skin: Skin is warm. No rash, no LE edema Psychiatric: Pt behavior is normal. No agitation.     Assessment & Plan:

## 2015-07-11 NOTE — Patient Instructions (Signed)
Please take all new medication as prescribed - the gel topical medication for pain, as well as the anti-inflammatory pill as needed for pain  Please continue all other medications as before, and refills have been done if requested.  Please have the pharmacy call with any other refills you may need.  Please continue your efforts at being more active, low cholesterol diet, and weight control.  Please keep your appointments with your specialists as you may have planned  You will be contacted regarding the referral for: orthopedic - dr Tamala Julian for the right wrist tendonitis  Please go to the XRAY Department in the Basement (go straight as you get off the elevator) for the x-ray testing  Please go to the LAB in the Basement (turn left off the elevator) for the tests to be done today  You will be contacted by phone if any changes need to be made immediately.  Otherwise, you will receive a letter about your results with an explanation, but please check with MyChart first.  Please remember to sign up for MyChart if you have not done so, as this will be important to you in the future with finding out test results, communicating by private email, and scheduling acute appointments online when needed.  Please return in 6 months, or sooner if needed, with Lab testing done 3-5 days before  .

## 2015-07-11 NOTE — Progress Notes (Signed)
Pre visit review using our clinic review tool, if applicable. No additional management support is needed unless otherwise documented below in the visit note. 

## 2015-07-11 NOTE — Assessment & Plan Note (Addendum)
No evidence for recurrent gout today, for bilat hand films but c/w with OA, likely inflamed related to work, for hand films, also volt gel prn, .also lab - crp. Esr., RF , ANA  Note:  Total time for pt hx, exam, review of record with pt in the room, determination of diagnoses and plan for further eval and tx is > 40 min, with over 50% spent in coordination and counseling of patient, limited due to interpretor need

## 2015-07-11 NOTE — Assessment & Plan Note (Signed)
Stable, pt request uric acid level today

## 2015-07-11 NOTE — Assessment & Plan Note (Signed)
stable overall by history and exam, recent data reviewed with pt, and pt to continue medical treatment as before,  to f/u any worsening symptoms or concerns Lab Results  Component Value Date   TSH 0.92 03/28/2015

## 2015-07-11 NOTE — Assessment & Plan Note (Signed)
For nsaid prn, also refer Sport med for ? Cortisone

## 2015-07-11 NOTE — Assessment & Plan Note (Signed)
With occipital radiation, c/w MSK +/- neuritic pain, for nsaid prn trial, consider gabapentin or nortryptilene if not improved

## 2015-07-12 LAB — ANA: ANA: NEGATIVE

## 2015-07-15 ENCOUNTER — Encounter: Payer: Self-pay | Admitting: Internal Medicine

## 2015-07-18 ENCOUNTER — Ambulatory Visit (INDEPENDENT_AMBULATORY_CARE_PROVIDER_SITE_OTHER)
Admission: RE | Admit: 2015-07-18 | Discharge: 2015-07-18 | Disposition: A | Discharge status: Home / Self Care | Payer: 59 | Source: Ambulatory Visit | Attending: Internal Medicine | Admitting: Internal Medicine

## 2015-07-18 DIAGNOSIS — M79643 Pain in unspecified hand: Secondary | ICD-10-CM

## 2015-07-18 DIAGNOSIS — M79642 Pain in left hand: Secondary | ICD-10-CM

## 2015-07-18 DIAGNOSIS — M79641 Pain in right hand: Secondary | ICD-10-CM

## 2015-07-19 ENCOUNTER — Encounter: Payer: Self-pay | Admitting: Internal Medicine

## 2015-07-21 ENCOUNTER — Other Ambulatory Visit: Payer: Self-pay | Admitting: Internal Medicine

## 2015-08-01 NOTE — Telephone Encounter (Signed)
Please call patient back at [phone]

## 2015-08-02 NOTE — Telephone Encounter (Signed)
LVM for pt to call back as soon as possible.   

## 2015-08-20 ENCOUNTER — Other Ambulatory Visit: Payer: Self-pay | Admitting: Internal Medicine

## 2015-08-21 ENCOUNTER — Other Ambulatory Visit: Payer: Self-pay

## 2015-08-21 MED ORDER — ALLOPURINOL 300 MG PO TABS: 300.0000 mg | ORAL_TABLET | Freq: Every day | ORAL | Status: DC

## 2015-09-20 ENCOUNTER — Other Ambulatory Visit: Payer: Self-pay | Admitting: Internal Medicine

## 2015-10-27 ENCOUNTER — Telehealth: Payer: Self-pay

## 2015-10-27 NOTE — Telephone Encounter (Signed)
PA received for pt's Voltaren gel [phone] ID [id number]. Left message on machine for pt to return my call to ask if he is continuing with this medication or if he would prefer referral to ortho as advised during last OV

## 2015-11-01 NOTE — Telephone Encounter (Signed)
Pt declined PA, will follow up with Dr Tamala Julian

## 2015-12-05 ENCOUNTER — Telehealth: Payer: Self-pay | Admitting: Internal Medicine

## 2015-12-05 DIAGNOSIS — M79641 Pain in right hand: Secondary | ICD-10-CM

## 2015-12-05 DIAGNOSIS — M79642 Pain in left hand: Principal | ICD-10-CM

## 2015-12-05 NOTE — Telephone Encounter (Signed)
Please advise patient is requesting to be sent to a rheumatologist for the pain in his hand

## 2015-12-05 NOTE — Telephone Encounter (Signed)
Patient would like to be referred to a rheumatologist for joint pain in his hand.

## 2015-12-08 NOTE — Telephone Encounter (Signed)
Referral done

## 2015-12-20 ENCOUNTER — Encounter (HOSPITAL_COMMUNITY): Payer: Self-pay | Admitting: Emergency Medicine

## 2015-12-20 ENCOUNTER — Emergency Department (HOSPITAL_COMMUNITY)
Admission: EM | Admit: 2015-12-20 | Discharge: 2015-12-21 | Disposition: A | Discharge status: Home / Self Care | Payer: BLUE CROSS/BLUE SHIELD | Attending: Emergency Medicine | Admitting: Emergency Medicine

## 2015-12-20 DIAGNOSIS — E079 Disorder of thyroid, unspecified: Secondary | ICD-10-CM | POA: Diagnosis not present

## 2015-12-20 DIAGNOSIS — M109 Gout, unspecified: Secondary | ICD-10-CM | POA: Diagnosis not present

## 2015-12-20 DIAGNOSIS — R140 Abdominal distension (gaseous): Secondary | ICD-10-CM | POA: Diagnosis not present

## 2015-12-20 DIAGNOSIS — K219 Gastro-esophageal reflux disease without esophagitis: Secondary | ICD-10-CM | POA: Insufficient documentation

## 2015-12-20 DIAGNOSIS — R630 Anorexia: Secondary | ICD-10-CM | POA: Insufficient documentation

## 2015-12-20 DIAGNOSIS — E785 Hyperlipidemia, unspecified: Secondary | ICD-10-CM | POA: Diagnosis not present

## 2015-12-20 DIAGNOSIS — Z79899 Other long term (current) drug therapy: Secondary | ICD-10-CM | POA: Diagnosis not present

## 2015-12-20 DIAGNOSIS — R1084 Generalized abdominal pain: Secondary | ICD-10-CM

## 2015-12-20 DIAGNOSIS — R110 Nausea: Secondary | ICD-10-CM | POA: Diagnosis not present

## 2015-12-20 DIAGNOSIS — E119 Type 2 diabetes mellitus without complications: Secondary | ICD-10-CM | POA: Diagnosis not present

## 2015-12-20 LAB — CBC
HCT: 46.3 % (ref 39.0–52.0)
Hemoglobin: 15.1 g/dL (ref 13.0–17.0)
MCH: 28.9 pg (ref 26.0–34.0)
MCHC: 32.6 g/dL (ref 30.0–36.0)
MCV: 88.7 fL (ref 78.0–100.0)
PLATELETS: 228 10*3/uL (ref 150–400)
RBC: 5.22 MIL/uL (ref 4.22–5.81)
RDW: 12.4 % (ref 11.5–15.5)
WBC: 8.9 10*3/uL (ref 4.0–10.5)

## 2015-12-20 LAB — URINALYSIS, ROUTINE W REFLEX MICROSCOPIC
BILIRUBIN URINE: NEGATIVE
Glucose, UA: NEGATIVE mg/dL
Ketones, ur: NEGATIVE mg/dL
Leukocytes, UA: NEGATIVE
NITRITE: NEGATIVE
PROTEIN: NEGATIVE mg/dL
Specific Gravity, Urine: 1.013 (ref 1.005–1.030)
pH: 7 (ref 5.0–8.0)

## 2015-12-20 LAB — COMPREHENSIVE METABOLIC PANEL
ALBUMIN: 4.3 g/dL (ref 3.5–5.0)
ALK PHOS: 28 U/L — AB (ref 38–126)
ALT: 25 U/L (ref 17–63)
AST: 33 U/L (ref 15–41)
Anion gap: 12 (ref 5–15)
BUN: 13 mg/dL (ref 6–20)
CHLORIDE: 100 mmol/L — AB (ref 101–111)
CO2: 28 mmol/L (ref 22–32)
CREATININE: 0.81 mg/dL (ref 0.61–1.24)
Calcium: 9.2 mg/dL (ref 8.9–10.3)
GFR calc non Af Amer: >60 mL/min (ref >60)
GLUCOSE: 84 mg/dL (ref 65–99)
Potassium: 3.5 mmol/L (ref 3.5–5.1)
Sodium: 140 mmol/L (ref 135–145)
Total Bilirubin: 0.7 mg/dL (ref 0.3–1.2)
Total Protein: 7.8 g/dL (ref 6.5–8.1)

## 2015-12-20 LAB — URINE MICROSCOPIC-ADD ON
Bacteria, UA: NONE SEEN
SQUAMOUS EPITHELIAL / LPF: NONE SEEN
WBC UA: NONE SEEN WBC/hpf (ref 0–5)

## 2015-12-20 LAB — LIPASE, BLOOD: LIPASE: 28 U/L (ref 11–51)

## 2015-12-20 NOTE — ED Provider Notes (Signed)
CSN: [id number]     Arrival date & time 12/20/15  2107 History  By signing my name below, I, Rayna Sexton, attest that this documentation has been prepared under the direction and in the presence of Everlene Balls, MD. Electronically Signed: Rayna Sexton, ED Scribe. 12/21/2015. 12:15 AM.    Chief Complaint  Patient presents with  . Abdominal Pain   The history is provided by the patient and a friend. No language interpreter was used.    HPI Comments: Samuel Meza is a 45 y.o. male who presents to the Emergency Department complaining of worsening, moderate, diffuse, abd pain onset months ago and worsening beginning a few days ago. He reports associated nausea which became constant 2 days ago and decreased appetite. Pt denies a hx of similar symptoms or a PMHx regarding his abdomen. His last BM was earlier today. He denies vomiting, decreased urinary frequency, fevers, chills or any other associated symptoms at this time. History obtained from friend in the room who translated.   Past Medical History  Diagnosis Date  . Hyperlipidemia   . Thyroid disease   . Gout   . Diabetes mellitus without complication ([place])   . Hemorrhoids, internal, with bleeding 07/20/2013  . GERD (gastroesophageal reflux disease) 03/01/2014   Past Surgical History  Procedure Laterality Date  . Esophagogastroduodenoscopy N/A 06/16/2013    Procedure: ESOPHAGOGASTRODUODENOSCOPY (EGD);  Surgeon: Milus Banister, MD;  Location: [facility];  Service: Endoscopy;  Laterality: N/A;  . Colonoscopy  2014  . Hemorrhoid banding  2014   Family History  Problem Relation Age of Onset  . Hypertension Other   . Hyperlipidemia Other   . Hyperlipidemia Mother   . Hypertension Mother   . Hyperlipidemia Father   . Hypertension Father   . Alcohol abuse Neg Hx   . Cancer Neg Hx   . Diabetes Neg Hx   . Early death Neg Hx   . Heart disease Neg Hx   . Kidney disease Neg Hx   . Stroke Neg Hx    Social History  Substance Use  Topics  . Smoking status: Never Smoker   . Smokeless tobacco: Never Used  . Alcohol Use: Yes    Review of Systems A complete 10 system review of systems was obtained and all systems are negative except as noted in the HPI and PMH.   Allergies  Review of patient's allergies indicates no known allergies.  Home Medications   Prior to Admission medications   Medication Sig Start Date End Date Taking? Authorizing Provider  allopurinol (ZYLOPRIM) 300 MG tablet TAKE ONE TABLET BY MOUTH ONCE DAILY 08/21/15  Yes Biagio Borg, MD  allopurinol (ZYLOPRIM) 300 MG tablet Take 1 tablet (300 mg total) by mouth daily. 08/21/15  Yes Biagio Borg, MD  diclofenac sodium (VOLTAREN) 1 % GEL Apply 4 g topically 4 (four) times daily as needed. 07/11/15  Yes Biagio Borg, MD  levothyroxine (SYNTHROID, LEVOTHROID) 88 MCG tablet Take 1 tablet (88 mcg total) by mouth daily. 01/24/15  Yes Biagio Borg, MD  Multiple Vitamin (MULTIVITAMIN) tablet Take 1 tablet by mouth daily.   Yes Historical Provider, MD  naproxen (NAPROSYN) 500 MG tablet Take 1 tablet (500 mg total) by mouth 2 (two) times daily as needed. 07/11/15  Yes Biagio Borg, MD  pantoprazole (PROTONIX) 40 MG tablet TAKE ONE TABLET BY MOUTH ONCE DAILY 09/20/15  Yes Gatha Mayer, MD  simvastatin (ZOCOR) 20 MG tablet TAKE ONE TABLET BY MOUTH  ONCE DAILY 07/21/15  Yes Biagio Borg, MD   BP 114/78 mmHg  Pulse 85  Temp(Src) 98.1 F (36.7 C) (Oral)  Resp 18  Wt 130 lb (58.968 kg)  SpO2 100%    Physical Exam  Constitutional: He is oriented to person, place, and time. Vital signs are normal. He appears well-developed and well-nourished.  Non-toxic appearance. He does not appear ill. No distress.  HENT:  Head: Normocephalic and atraumatic.  Nose: Nose normal.  Mouth/Throat: Oropharynx is clear and moist. No oropharyngeal exudate.  Eyes: Conjunctivae and EOM are normal. Pupils are equal, round, and reactive to light. No scleral icterus.  Neck: Normal range of  motion. Neck supple. No tracheal deviation, no edema, no erythema and normal range of motion present. No thyroid mass and no thyromegaly present.  Cardiovascular: Normal rate, regular rhythm, S1 normal, S2 normal, normal heart sounds, intact distal pulses and normal pulses.  Exam reveals no gallop and no friction rub.   No murmur heard. Pulmonary/Chest: Effort normal and breath sounds normal. No respiratory distress. He has no wheezes. He has no rhonchi. He has no rales.  Abdominal: Soft. Normal appearance and bowel sounds are normal. He exhibits distension. He exhibits no ascites and no mass. There is no hepatosplenomegaly. There is no tenderness. There is no rebound, no guarding and no CVA tenderness.  Musculoskeletal: Normal range of motion. He exhibits no edema or tenderness.  Lymphadenopathy:    He has no cervical adenopathy.  Neurological: He is alert and oriented to person, place, and time. He has normal strength. No cranial nerve deficit or sensory deficit.  Skin: Skin is warm, dry and intact. No petechiae and no rash noted. He is not diaphoretic. No erythema. No pallor.  Psychiatric: He has a normal mood and affect. His behavior is normal. Judgment normal.  Nursing note and vitals reviewed.   ED Course  Procedures  DIAGNOSTIC STUDIES: Oxygen Saturation is 100% on RA, normal by my interpretation.    COORDINATION OF CARE: 12:02 AM Discussed next steps with pt and he agreed with the plan.   Labs Review Labs Reviewed  COMPREHENSIVE METABOLIC PANEL - Abnormal; Notable for the following:    Chloride 100 (*)    Alkaline Phosphatase 28 (*)    All other components within normal limits  URINALYSIS, ROUTINE W REFLEX MICROSCOPIC (NOT AT [place]) - Abnormal; Notable for the following:    Hgb urine dipstick TRACE (*)    All other components within normal limits  LIPASE, BLOOD  CBC  URINE MICROSCOPIC-ADD ON    Imaging Review US Abdomen Complete  12/21/2015  CLINICAL DATA:  Chronic  worsening generalized abdominal distention and abdominal pain. Initial encounter. EXAM: ABDOMEN ULTRASOUND COMPLETE COMPARISON:  Abdominal ultrasound performed 06/30/2014 FINDINGS: Gallbladder: No gallstones or wall thickening visualized. No sonographic Murphy sign noted by sonographer. Common bile duct: Diameter: 0.3 cm, within normal limits in caliber. Liver: No focal lesion identified. Within normal limits in parenchymal echogenicity. IVC: No abnormality visualized. Pancreas: Visualized portion unremarkable. Spleen: Size and appearance within normal limits. Right Kidney: Length: 9.4 cm. Echogenicity within normal limits. No mass or hydronephrosis visualized. Left Kidney: Length: 9.3 cm. Echogenicity within normal limits. No mass or hydronephrosis visualized. Abdominal aorta: No aneurysm visualized. Other findings: None. IMPRESSION: Unremarkable abdominal ultrasound. Electronically Signed   By: Garald Balding M.D.   On: 12/21/2015 00:27   I have personally reviewed and evaluated these images and lab results as part of my medical decision-making.   EKG Interpretation  None      MDM   Final diagnoses:  None    Patient presents to the ED for diffuse abd distension and pain. This has been going on for months and worsening in the last 3 days.  Labs are normal.  PE shows no tenderness. Will obtain US for further evaluation.  He was given toradol, zofran and tramadol for pain control.  Ultrasound is unremarkable. Patient feels better after pain medication. We'll discharge home with Zofran as needed. Primary care follow-up is advised. He appears well-developed acute distress, vital signs were within his normal limits and he is safe for discharge.  I personally performed the services described in this documentation, which was scribed in my presence. The recorded information has been reviewed and is accurate.      Everlene Balls, MD 12/21/15 [phone]

## 2015-12-20 NOTE — ED Notes (Signed)
Pt. reports persistent generalized chronic abdominal pain for several months with abdominal swelling/distention , occasional nausea , denies diarrhea or fever .

## 2015-12-20 NOTE — ED Notes (Signed)
Pt c/o abdominal pain for months with intermittent nausea. Pain has worsened over the past 2-3 days with constant nausea. Last BM today. abd distended, denies urinary issues

## 2015-12-21 ENCOUNTER — Emergency Department (HOSPITAL_COMMUNITY): Payer: BLUE CROSS/BLUE SHIELD

## 2015-12-21 MED ORDER — KETOROLAC TROMETHAMINE 60 MG/2ML IM SOLN
60.0000 mg | Freq: Once | INTRAMUSCULAR | Status: AC
  Administered 2015-12-21: 60 mg via INTRAMUSCULAR
  Filled 2015-12-21: qty 2

## 2015-12-21 MED ORDER — ONDANSETRON 4 MG PO TBDP: 4.0000 mg | ORAL_TABLET | Freq: Every day | ORAL | Status: DC

## 2015-12-21 MED ORDER — TRAMADOL HCL 50 MG PO TABS
50.0000 mg | ORAL_TABLET | Freq: Once | ORAL | Status: AC
  Administered 2015-12-21: 50 mg via ORAL
  Filled 2015-12-21: qty 1

## 2015-12-21 MED ORDER — ONDANSETRON 4 MG PO TBDP
4.0000 mg | ORAL_TABLET | Freq: Once | ORAL | Status: AC
  Administered 2015-12-21: 4 mg via ORAL
  Filled 2015-12-21: qty 1

## 2015-12-21 NOTE — Discharge Instructions (Signed)
?au b?ng, Ng??i l?n (Abdominal Pain, Adult) Mr. Viebrock, your ultrasound and blood work today are normal.  See a primary care physician within 3 days for close follow up.  Take zofran as needed for nausea. If symptoms worsen, come back to the ED immediately. Thank you.    ng Charolotte Capuchin, siu m v cng vi?c mu ngy nay l bnh th??ng. Xem bc s? ch?m [place] chnh trong vng 3 ngy ?? theo di ch?t ch?. Dng zofran n?u c?n cho bu?n nn. N?u cc tri?u ch?ng x?u ?i, hy [place] ED ngay l?p t?c. C?m ?n b?n.  C nhi?u nguyn nhn d?n ??n ?au b?ng. Thng th??ng ?au b?ng l do m?t b?nh gy ra v s? khng ?? n?u khng ?i?u tr?Marland Kitchen B?nh ny c th? ???c theo di v ?i?u tr? t?i nh. Chuyn gia ch?m [place] s?c kh?e s? ti?n hnh khm th?c th? v c th? yu c?u lm xt nghi?m mu v ch?p X quang ?? xc ??nh m?c ?? nghim tr?ng c?a c?n ?au b?ng. Tuy nhin, trong nhi?u tr??ng h?p, ph?i m?t nhi?u th?i gian h?n ?? xc ??nh r nguyn nhn gy ?au b?ng. Tr??c khi tm ra nguyn nhn, chuyn gia ch?m [place] s?c kh?e c th? khng bi?t li?u qu v? c c?n lm thm xt nghi?m ho?c ti?p t?c ?i?u tr? hay khng. H??NG D?N CH?M [place] T?I [place]  Theo di c?n ?au b?ng xem c b?t k? thay ??i no khng. Nh?ng hnh ??ng sau c th? gip lo?i b? b?t c? c?m gic kh ch?u no qu v? ?ang b?.  Ch? s? d?ng thu?c khng c?n k ??n ho?c thu?c c?n k ??n theo ch? d?n c?a chuyn gia ch?m [place] s?c kh?e.  Khng dng thu?c nhu?n trng tr? khi ???c chuyn gia ch?m [place] s?c kh?e ch? ??nh.  Th? dng m?t ch? ?? ?n l?ng (n??c lu?c th?t, tr, ho?c n??c) theo ch? d?n c?a chuyn gia ch?m [place]  s?c kh?e. Chuy?n d?n sang m?t ch? ?? ?n nh? n?u ch?u ???c. ?I [facility] N?U:  Qu v? b? ?au b?ng khng r nguyn nhn.  Qu v? b? ?au b?ng km theo bu?n nn ho?c tiu ch?y.  Qu v? b? ?au khi ?i ti?u ho?c ?i ngoi.  Qu v? b? c?n ?au b?ng lm th?c gi?c vo ban ?m.  Qu v? b? ?au b?ng n?ng thm ho?c ?? h?n khi ?n.  Qu v? b? ?au b?ng n?ng thm khi ?n ?? ?n nhi?u ch?t bo.  Qu v?  b? s?t. NGAY L?P T?C ?I K[facility] N?U:   C?n ?au khng kh?i trong vng 2 gi?Sander Nephew v? v?n ti?p t?c b? i (nn m?a).  Ch? c th? c?m th?y ?au ? m?t s? ph?n b?ng, ch?ng h?n nh? ? ph?n b?ng bn ph?i ho?c bn d??i tri.  Phn c?a qu v? c mu ho?c c mu ?en nh? h?c n. ??M B?O QU V?:  Hi?u r cc h??ng d?n ny.  S? theo di tnh tr?ng c?a mnh.  S? yu c?u tr? gip ngay l?p t?c n?u qu v? c?m th?y khng kh?e ho?c th?y tr?m tr?ng h?n.   Thng tin ny khng nh?m m?c ?ch thay th? cho l?i khuyn m chuyn gia ch?m [place] s?c kh?e ni v?i qu v?. Hy b?o ??m qu v? ph?i th?o lu?n b?t k? v?n ?? g m qu v? c v?i chuyn gia ch?m [place] s?c kh?e c?a qu v?.   Document Released: 09/23/2005 Document Revised: 10/14/2014 Elsevier Interactive Patient Education 2016 [employer]  Inc. ° °

## 2015-12-26 ENCOUNTER — Ambulatory Visit: Payer: Self-pay | Admitting: Internal Medicine

## 2016-01-30 ENCOUNTER — Other Ambulatory Visit: Payer: Self-pay | Admitting: Internal Medicine

## 2016-01-30 ENCOUNTER — Other Ambulatory Visit (INDEPENDENT_AMBULATORY_CARE_PROVIDER_SITE_OTHER): Payer: BLUE CROSS/BLUE SHIELD

## 2016-01-30 ENCOUNTER — Encounter: Payer: Self-pay | Admitting: Internal Medicine

## 2016-01-30 ENCOUNTER — Telehealth: Payer: Self-pay | Admitting: Internal Medicine

## 2016-01-30 ENCOUNTER — Ambulatory Visit (INDEPENDENT_AMBULATORY_CARE_PROVIDER_SITE_OTHER): Payer: BLUE CROSS/BLUE SHIELD | Admitting: Internal Medicine

## 2016-01-30 ENCOUNTER — Telehealth: Payer: Self-pay

## 2016-01-30 VITALS — BP 110/70 | HR 66 | Temp 97.6°F | Resp 20 | Wt 123.0 lb

## 2016-01-30 DIAGNOSIS — R39198 Other difficulties with micturition: Secondary | ICD-10-CM

## 2016-01-30 DIAGNOSIS — K219 Gastro-esophageal reflux disease without esophagitis: Secondary | ICD-10-CM

## 2016-01-30 DIAGNOSIS — R109 Unspecified abdominal pain: Secondary | ICD-10-CM | POA: Insufficient documentation

## 2016-01-30 DIAGNOSIS — R1030 Lower abdominal pain, unspecified: Secondary | ICD-10-CM | POA: Diagnosis not present

## 2016-01-30 DIAGNOSIS — Z0001 Encounter for general adult medical examination with abnormal findings: Secondary | ICD-10-CM | POA: Diagnosis not present

## 2016-01-30 DIAGNOSIS — R7302 Impaired glucose tolerance (oral): Secondary | ICD-10-CM

## 2016-01-30 DIAGNOSIS — E039 Hypothyroidism, unspecified: Secondary | ICD-10-CM

## 2016-01-30 DIAGNOSIS — R6889 Other general symptoms and signs: Secondary | ICD-10-CM

## 2016-01-30 LAB — CBC WITH DIFFERENTIAL/PLATELET
BASOS PCT: 0.3 % (ref 0.0–3.0)
Basophils Absolute: 0 10*3/uL (ref 0.0–0.1)
EOS PCT: 1 % (ref 0.0–5.0)
Eosinophils Absolute: 0.1 10*3/uL (ref 0.0–0.7)
HEMATOCRIT: 45.8 % (ref 39.0–52.0)
HEMOGLOBIN: 15.2 g/dL (ref 13.0–17.0)
LYMPHS PCT: 24 % (ref 12.0–46.0)
Lymphs Abs: 1.7 10*3/uL (ref 0.7–4.0)
MCHC: 33.3 g/dL (ref 30.0–36.0)
MCV: 87.1 fl (ref 78.0–100.0)
MONO ABS: 0.6 10*3/uL (ref 0.1–1.0)
Monocytes Relative: 8.1 % (ref 3.0–12.0)
NEUTROS ABS: 4.8 10*3/uL (ref 1.4–7.7)
NEUTROS PCT: 66.6 % (ref 43.0–77.0)
PLATELETS: 234 10*3/uL (ref 150.0–400.0)
RBC: 5.26 Mil/uL (ref 4.22–5.81)
RDW: 12.9 % (ref 11.5–15.5)
WBC: 7.2 10*3/uL (ref 4.0–10.5)

## 2016-01-30 LAB — URINALYSIS, ROUTINE W REFLEX MICROSCOPIC
Bilirubin Urine: NEGATIVE
HGB URINE DIPSTICK: NEGATIVE
Ketones, ur: NEGATIVE
Leukocytes, UA: NEGATIVE
Nitrite: NEGATIVE
Specific Gravity, Urine: <=1.005 — AB (ref 1.000–1.030)
Total Protein, Urine: NEGATIVE
UROBILINOGEN UA: 0.2 (ref 0.0–1.0)
Urine Glucose: NEGATIVE
WBC, UA: NONE SEEN — AB (ref 0–?)
pH: 7 (ref 5.0–8.0)

## 2016-01-30 LAB — HEPATIC FUNCTION PANEL
ALT: 23 U/L (ref 0–53)
AST: 20 U/L (ref 0–37)
Albumin: 4.8 g/dL (ref 3.5–5.2)
Alkaline Phosphatase: 32 U/L — ABNORMAL LOW (ref 39–117)
BILIRUBIN DIRECT: 0.2 mg/dL (ref 0.0–0.3)
BILIRUBIN TOTAL: 1 mg/dL (ref 0.2–1.2)
Total Protein: 7.6 g/dL (ref 6.0–8.3)

## 2016-01-30 LAB — BASIC METABOLIC PANEL
BUN: 17 mg/dL (ref 6–23)
CHLORIDE: 101 meq/L (ref 96–112)
CO2: 33 mEq/L — ABNORMAL HIGH (ref 19–32)
Calcium: 10 mg/dL (ref 8.4–10.5)
Creatinine, Ser: 0.87 mg/dL (ref 0.40–1.50)
GFR: 101.37 mL/min (ref >60.00)
Glucose, Bld: 95 mg/dL (ref 70–99)
POTASSIUM: 4.2 meq/L (ref 3.5–5.1)
Sodium: 140 mEq/L (ref 135–145)

## 2016-01-30 LAB — LIPID PANEL
CHOL/HDL RATIO: 3
Cholesterol: 124 mg/dL (ref 0–200)
HDL: 40.5 mg/dL (ref >39.00)
LDL CALC: 60 mg/dL (ref 0–99)
NONHDL: 83.63
TRIGLYCERIDES: 118 mg/dL (ref 0.0–149.0)
VLDL: 23.6 mg/dL (ref 0.0–40.0)

## 2016-01-30 LAB — PSA: PSA: 1.17 ng/mL (ref 0.10–4.00)

## 2016-01-30 LAB — TSH: TSH: 1.24 u[IU]/mL (ref 0.35–4.50)

## 2016-01-30 LAB — LIPASE: Lipase: 16 U/L (ref 11.0–59.0)

## 2016-01-30 MED ORDER — DICYCLOMINE HCL 10 MG PO CAPS: 10.0000 mg | ORAL_CAPSULE | Freq: Three times a day (TID) | ORAL | Status: DC

## 2016-01-30 MED ORDER — PANTOPRAZOLE SODIUM 40 MG PO TBEC: 40.0000 mg | DELAYED_RELEASE_TABLET | Freq: Every day | ORAL | Status: DC

## 2016-01-30 MED ORDER — LEVOTHYROXINE SODIUM 88 MCG PO TABS: 88.0000 ug | ORAL_TABLET | Freq: Every day | ORAL | Status: DC

## 2016-01-30 MED ORDER — SIMVASTATIN 20 MG PO TABS: 20.0000 mg | ORAL_TABLET | Freq: Every day | ORAL | Status: DC

## 2016-01-30 MED ORDER — ONDANSETRON 4 MG PO TBDP: 4.0000 mg | ORAL_TABLET | Freq: Every day | ORAL | Status: DC

## 2016-01-30 MED ORDER — DOXYCYCLINE HYCLATE 100 MG PO TABS: 100.0000 mg | ORAL_TABLET | Freq: Two times a day (BID) | ORAL | Status: DC

## 2016-01-30 MED ORDER — ALLOPURINOL 300 MG PO TABS: 300.0000 mg | ORAL_TABLET | Freq: Every day | ORAL | Status: DC

## 2016-01-30 MED ORDER — DICLOFENAC SODIUM 1 % TD GEL: 4.0000 g | Freq: Four times a day (QID) | TRANSDERMAL | Status: DC | PRN

## 2016-01-30 MED ORDER — DOXYCYCLINE HYCLATE 100 MG PO TABS
100.0000 mg | ORAL_TABLET | Freq: Two times a day (BID) | ORAL | Status: DC
Start: 2016-01-30 — End: 2016-01-30

## 2016-01-30 NOTE — Telephone Encounter (Signed)
Medications sent to new pharmacy 

## 2016-01-30 NOTE — Patient Instructions (Signed)
Please take all new medication as prescribed - the bentyl for crampy pains  You can also take Miralax 17 mg per day, and Colace 100 mg twice per day as needed for constipation  Please take all new medication as prescribed - also the antibiotic for possible prostatitis  You will be contacted regarding the referral for: Gastroenterology  Please continue all other medications as before, and refills have been done if requested.  Please have the pharmacy call with any other refills you may need.  Please continue your efforts at being more active, low cholesterol diet, and weight control.  You are otherwise up to date with prevention measures today.  Please keep your appointments with your specialists as you may have planned  Please go to the LAB in the Basement (turn left off the elevator) for the tests to be done today  You will be contacted by phone if any changes need to be made immediately.  Otherwise, you will receive a letter about your results with an explanation, but please check with MyChart first.  Please remember to sign up for MyChart if you have not done so, as this will be important to you in the future with finding out test results, communicating by private email, and scheduling acute appointments online when needed.  Please return in 6 months, or sooner if needed

## 2016-01-30 NOTE — Progress Notes (Signed)
Pre visit review using our clinic review tool, if applicable. No additional management support is needed unless otherwise documented below in the visit note. 

## 2016-01-30 NOTE — Assessment & Plan Note (Signed)

## 2016-01-30 NOTE — Progress Notes (Signed)
Subjective:    Patient ID: Samuel Meza, male    DOB: Feb 03, 1971, 45 y.o.   MRN: [id number]  HPI  Here with interpretor; Here for wellness and f/u;  Overall doing ok;  Pt denies Chest pain, worsening SOB, DOE, wheezing, orthopnea, PND, worsening LE edema, palpitations, dizziness or syncope.  Pt denies neurological change such as new headache, facial or extremity weakness.  Pt denies polydipsia, polyuria, or low sugar symptoms. Pt states overall good compliance with treatment and medications, good tolerability, and has been trying to follow appropriate diet.  Pt denies worsening depressive symptoms, suicidal ideation or panic. No fever, night sweats, wt loss, loss of appetite, or other constitutional symptoms.  Pt states good ability with ADL's, has low fall risk, home safety reviewed and adequate, no other significant changes in hearing or vision, and only occasionally active with exercise.  Has ongoing stress and sometimes feels low, but also with recurring mid lower abd pain, crampy, mild to mod,  X 12 months but worse last 2 mo, assoc with constipation and some difficulty passing stools, sometimes hard stools.  Food sometimes seems not so digested. Denies worsening reflux, other abd pain, dysphagia, vomiting, diarrhea or blood, but sometimes gets mucous passed.  Has had some wt loss setp 2014 egd/colonoscopy with duodenal ulcer/int hemorrhoids Wt Readings from Last 3 Encounters:  01/30/16 123 lb (55.792 kg)  12/20/15 130 lb (58.968 kg)  07/11/15 127 lb (57.607 kg)  Abd u/s neg for acute mar 2017. Also with "slowed " urine at times - Denies urinary symptoms such as dysuria, frequency, urgency, flank pain, hematuria or n/v, fever, chills.  Denies hyper or hypo thyroid symptoms such as voice, skin or hair change.  Past Medical History  Diagnosis Date  . Hyperlipidemia   . Thyroid disease   . Gout   . Diabetes mellitus without complication ([place])   . Hemorrhoids, internal, with bleeding 07/20/2013  .  GERD (gastroesophageal reflux disease) 03/01/2014   Past Surgical History  Procedure Laterality Date  . Esophagogastroduodenoscopy N/A 06/16/2013    Procedure: ESOPHAGOGASTRODUODENOSCOPY (EGD);  Surgeon: Milus Banister, MD;  Location: [place];  Service: Endoscopy;  Laterality: N/A;  . Colonoscopy  2014  . Hemorrhoid banding  2014    reports that he has never smoked. He has never used smokeless tobacco. He reports that he drinks alcohol. He reports that he does not use illicit drugs. family history includes Hyperlipidemia in his father, mother, and other; Hypertension in his father, mother, and other. There is no history of Alcohol abuse, Cancer, Diabetes, Early death, Heart disease, Kidney disease, or Stroke. No Known Allergies Current Outpatient Prescriptions on File Prior to Visit  Medication Sig Dispense Refill  . allopurinol (ZYLOPRIM) 300 MG tablet TAKE ONE TABLET BY MOUTH ONCE DAILY 90 tablet 0  . allopurinol (ZYLOPRIM) 300 MG tablet Take 1 tablet (300 mg total) by mouth daily. 90 tablet 3  . diclofenac sodium (VOLTAREN) 1 % GEL Apply 4 g topically 4 (four) times daily as needed. 400 g 11  . levothyroxine (SYNTHROID, LEVOTHROID) 88 MCG tablet Take 1 tablet (88 mcg total) by mouth daily. 90 tablet 3  . Multiple Vitamin (MULTIVITAMIN) tablet Take 1 tablet by mouth daily.    . naproxen (NAPROSYN) 500 MG tablet Take 1 tablet (500 mg total) by mouth 2 (two) times daily as needed. 60 tablet 5  . ondansetron (ZOFRAN-ODT) 4 MG disintegrating tablet Take 1 tablet (4 mg total) by mouth daily. 12 tablet 0  .  pantoprazole (PROTONIX) 40 MG tablet TAKE ONE TABLET BY MOUTH ONCE DAILY 90 tablet 0  . simvastatin (ZOCOR) 20 MG tablet TAKE ONE TABLET BY MOUTH ONCE DAILY 90 tablet 3   No current facility-administered medications on file prior to visit.   Review of Systems Constitutional: Negative for increased diaphoresis, or other activity, appetite or siginficant weight change other than  noted HENT: Negative for worsening hearing loss, ear pain, facial swelling, mouth sores and neck stiffness.   Eyes: Negative for other worsening pain, redness or visual disturbance.  Respiratory: Negative for choking or stridor Cardiovascular: Negative for other chest pain and palpitations.  Gastrointestinal: Negative for worsening diarrhea, blood in stool, or abdominal distention Genitourinary: Negative for hematuria, flank pain or change in urine volume.  Musculoskeletal: Negative for myalgias or other joint complaints.  Skin: Negative for other color change and wound or drainage.  Neurological: Negative for syncope and numbness. other than noted Hematological: Negative for adenopathy. or other swelling Psychiatric/Behavioral: Negative for hallucinations, SI, self-injury, decreased concentration or other worsening agitation.      Objective:   Physical Exam BP 110/70 mmHg  Pulse 66  Temp(Src) 97.6 F (36.4 C) (Oral)  Resp 20  Wt 123 lb (55.792 kg)  SpO2 99% VS noted,  Constitutional: Pt is oriented to person, place, and time. Appears well-developed and well-nourished, in no significant distress Head: Normocephalic and atraumatic  Eyes: Conjunctivae and EOM are normal. Pupils are equal, round, and reactive to light Right Ear: External ear normal.  Left Ear: External ear normal Nose: Nose normal.  Mouth/Throat: Oropharynx is clear and moist  Neck: Normal range of motion. Neck supple. No JVD present. No tracheal deviation present or significant neck LA or mass Cardiovascular: Normal rate, regular rhythm, normal heart sounds and intact distal pulses.   Pulmonary/Chest: Effort normal and breath sounds without rales or wheezing  Abdominal: Soft. Bowel sounds are normal. NT. No HSM  except for mild epigastric tender, no guarding or rebound Musculoskeletal: Normal range of motion. Exhibits no edema Lymphadenopathy: Has no cervical adenopathy.  Neurological: Pt is alert and oriented to  person, place, and time. Pt has normal reflexes. No cranial nerve deficit. Motor grossly intact Skin: Skin is warm and dry. No rash noted or new ulcers Psychiatric:  Has normal mood and affect. Behavior is normal.     Assessment & Plan:

## 2016-01-30 NOTE — Telephone Encounter (Signed)
Pt called stated he change this pharmacy to [facility] on [address] and [address] now, can we change the pharmacy in [place] and resend all of his Rx to [facility] today.

## 2016-01-31 ENCOUNTER — Telehealth: Payer: Self-pay

## 2016-01-31 NOTE — Telephone Encounter (Signed)
APPROVED through 10/06/2038

## 2016-01-31 NOTE — Telephone Encounter (Signed)
PA initiated via CoverMyMeds key [id number]

## 2016-02-02 DIAGNOSIS — R39198 Other difficulties with micturition: Secondary | ICD-10-CM | POA: Insufficient documentation

## 2016-02-02 NOTE — Assessment & Plan Note (Signed)
stable overall by history and exam, recent data reviewed with pt, and pt to continue medical treatment as before,  to f/u any worsening symptoms or concerns Lab Results  Component Value Date   WBC 7.2 01/30/2016   HGB 15.2 01/30/2016   HCT 45.8 01/30/2016   PLT 234.0 01/30/2016   GLUCOSE 95 01/30/2016   CHOL 124 01/30/2016   TRIG 118.0 01/30/2016   HDL 40.50 01/30/2016   LDLCALC 60 01/30/2016   ALT 23 01/30/2016   AST 20 01/30/2016   NA 140 01/30/2016   K 4.2 01/30/2016   CL 101 01/30/2016   CREATININE 0.87 01/30/2016   BUN 17 01/30/2016   CO2 33* 01/30/2016   TSH 1.24 01/30/2016   PSA 1.17 01/30/2016   INR 1.1* 08/30/2013   HGBA1C 5.7 01/17/2015

## 2016-02-02 NOTE — Assessment & Plan Note (Addendum)
Etiology unclear, check lipase with labs today, for bentyl prn crampy pains, miralax prn constipation, cant r/o IBD, for GI referral,  ? Need f/u colonoscopy  In addition to the time spent performing CPE, I spent an additional 40 minutes face to face,in which greater than 50% of this time was spent in counseling and coordination of care for patient's acute illness as documented.

## 2016-02-02 NOTE — Assessment & Plan Note (Signed)
stable overall by history and exam, recent data reviewed with pt, and pt to continue medical treatment as before,  to f/u any worsening symptoms or concerns Lab Results  Component Value Date   HGBA1C 5.7 01/17/2015

## 2016-02-02 NOTE — Assessment & Plan Note (Signed)
stable overall by history and exam, recent data reviewed with pt, and pt to continue medical treatment as before,  to f/u any worsening symptoms or concerns Lab Results  Component Value Date   TSH 1.24 01/30/2016

## 2016-02-02 NOTE — Assessment & Plan Note (Signed)
?   Prostatitis, for empiric doxycycline asd, check UA,  to f/u any worsening symptoms or concerns

## 2016-04-23 ENCOUNTER — Encounter: Payer: Self-pay | Admitting: Internal Medicine

## 2016-04-23 ENCOUNTER — Ambulatory Visit (INDEPENDENT_AMBULATORY_CARE_PROVIDER_SITE_OTHER): Payer: BLUE CROSS/BLUE SHIELD | Admitting: Internal Medicine

## 2016-04-23 ENCOUNTER — Other Ambulatory Visit (INDEPENDENT_AMBULATORY_CARE_PROVIDER_SITE_OTHER): Payer: BLUE CROSS/BLUE SHIELD

## 2016-04-23 VITALS — BP 122/80 | HR 51 | Temp 98.4°F | Resp 20 | Wt 119.0 lb

## 2016-04-23 DIAGNOSIS — E039 Hypothyroidism, unspecified: Secondary | ICD-10-CM

## 2016-04-23 DIAGNOSIS — R102 Pelvic and perineal pain: Secondary | ICD-10-CM

## 2016-04-23 DIAGNOSIS — M545 Low back pain, unspecified: Secondary | ICD-10-CM

## 2016-04-23 DIAGNOSIS — K921 Melena: Secondary | ICD-10-CM | POA: Diagnosis not present

## 2016-04-23 DIAGNOSIS — R39198 Other difficulties with micturition: Secondary | ICD-10-CM

## 2016-04-23 DIAGNOSIS — R5383 Other fatigue: Secondary | ICD-10-CM

## 2016-04-23 DIAGNOSIS — R140 Abdominal distension (gaseous): Secondary | ICD-10-CM

## 2016-04-23 DIAGNOSIS — G5603 Carpal tunnel syndrome, bilateral upper limbs: Secondary | ICD-10-CM

## 2016-04-23 DIAGNOSIS — F411 Generalized anxiety disorder: Secondary | ICD-10-CM

## 2016-04-23 DIAGNOSIS — K219 Gastro-esophageal reflux disease without esophagitis: Secondary | ICD-10-CM

## 2016-04-23 DIAGNOSIS — G5600 Carpal tunnel syndrome, unspecified upper limb: Secondary | ICD-10-CM | POA: Insufficient documentation

## 2016-04-23 DIAGNOSIS — R634 Abnormal weight loss: Secondary | ICD-10-CM | POA: Diagnosis not present

## 2016-04-23 DIAGNOSIS — R197 Diarrhea, unspecified: Secondary | ICD-10-CM

## 2016-04-23 LAB — URINALYSIS, ROUTINE W REFLEX MICROSCOPIC
BILIRUBIN URINE: NEGATIVE
HGB URINE DIPSTICK: NEGATIVE
KETONES UR: NEGATIVE
LEUKOCYTES UA: NEGATIVE
Nitrite: NEGATIVE
RBC / HPF: NONE SEEN (ref 0–?)
Specific Gravity, Urine: <=1.005 — AB (ref 1.000–1.030)
TOTAL PROTEIN, URINE-UPE24: NEGATIVE
URINE GLUCOSE: NEGATIVE
UROBILINOGEN UA: 0.2 (ref 0.0–1.0)
WBC, UA: NONE SEEN (ref 0–?)
pH: 7 (ref 5.0–8.0)

## 2016-04-23 LAB — SEDIMENTATION RATE: Sed Rate: 6 mm/hr (ref 0–15)

## 2016-04-23 LAB — BASIC METABOLIC PANEL
BUN: 23 mg/dL (ref 6–23)
CALCIUM: 9.7 mg/dL (ref 8.4–10.5)
CO2: 33 mEq/L — ABNORMAL HIGH (ref 19–32)
Chloride: 101 mEq/L (ref 96–112)
Creatinine, Ser: 0.87 mg/dL (ref 0.40–1.50)
GFR: 101.26 mL/min (ref >60.00)
GLUCOSE: 90 mg/dL (ref 70–99)
Potassium: 4.5 mEq/L (ref 3.5–5.1)
SODIUM: 140 meq/L (ref 135–145)

## 2016-04-23 LAB — HEPATIC FUNCTION PANEL
ALBUMIN: 4.7 g/dL (ref 3.5–5.2)
ALK PHOS: 31 U/L — AB (ref 39–117)
ALT: 25 U/L (ref 0–53)
AST: 22 U/L (ref 0–37)
Bilirubin, Direct: 0.1 mg/dL (ref 0.0–0.3)
Total Bilirubin: 0.4 mg/dL (ref 0.2–1.2)
Total Protein: 7.8 g/dL (ref 6.0–8.3)

## 2016-04-23 LAB — TSH: TSH: 1.66 u[IU]/mL (ref 0.35–4.50)

## 2016-04-23 LAB — C-REACTIVE PROTEIN: CRP: <0.1 mg/dL — ABNORMAL LOW (ref 0.5–20.0)

## 2016-04-23 LAB — TESTOSTERONE: TESTOSTERONE: 304.35 ng/dL (ref 300.00–890.00)

## 2016-04-23 LAB — LIPASE: Lipase: 14 U/L (ref 11.0–59.0)

## 2016-04-23 MED ORDER — DOXYCYCLINE HYCLATE 100 MG PO TABS: 100.0000 mg | ORAL_TABLET | Freq: Two times a day (BID) | ORAL | Status: DC

## 2016-04-23 NOTE — Progress Notes (Signed)
Subjective:    Patient ID: Samuel Meza, male    DOB: [birth year], 45 y.o.   MRN: [id number]  HPI  Here to f/u alone, though we are able to access video interpretor service.  Pt is difficult historian but is apparent he is very concerned something he is fearful of something seriously wrong but hard to pinpoint,  Pt has recent wt loss 5-6 lbs over 3 mo, abd pains dull fleetingm assoc with low appetite, just doe not feel like eating, occas diarrhea and one episode last wk of small volume BRB.  No radiation of pain, and Denies worsening reflux, dysphagia, n/v  Pt continues to have recurring LBP without change in severity, bowel or bladder change, fever, wt loss,  worsening LE pain/numbness/weakness, gait change or falls.  Also with other complaints of bilat hand nuimbness, some burning with urination for 2 days, persistent fatigue and anxiety.  Denies hyper or hypo thyroid symptoms such as voice, skin or hair change. Past Medical History:  Diagnosis Date  . Diabetes mellitus without complication ([place])   . GERD (gastroesophageal reflux disease) 03/01/2014  . Gout   . Hemorrhoids, internal, with bleeding 07/20/2013  . Hyperlipidemia   . Thyroid disease    Past Surgical History:  Procedure Laterality Date  . COLONOSCOPY  2014  . ESOPHAGOGASTRODUODENOSCOPY N/A 06/16/2013   Procedure: ESOPHAGOGASTRODUODENOSCOPY (EGD);  Surgeon: Milus Banister, MD;  Location: [place];  Service: Endoscopy;  Laterality: N/A;  . HEMORRHOID BANDING  2014    reports that he has never smoked. He has never used smokeless tobacco. He reports that he drinks alcohol. He reports that he does not use drugs. family history includes Hyperlipidemia in his father, mother, and other; Hypertension in his father, mother, and other. No Known Allergies Current Outpatient Prescriptions on File Prior to Visit  Medication Sig Dispense Refill  . allopurinol (ZYLOPRIM) 300 MG tablet Take 1 tablet (300 mg total) by mouth daily. 90 tablet 3   . dicyclomine (BENTYL) 10 MG capsule Take 1 capsule (10 mg total) by mouth 4 (four) times daily -  before meals and at bedtime. 90 capsule 1  . levothyroxine (SYNTHROID, LEVOTHROID) 88 MCG tablet Take 1 tablet (88 mcg total) by mouth daily. 90 tablet 3  . Multiple Vitamin (MULTIVITAMIN) tablet Take 1 tablet by mouth daily.    . pantoprazole (PROTONIX) 40 MG tablet Take 1 tablet (40 mg total) by mouth daily. 90 tablet 3  . simvastatin (ZOCOR) 20 MG tablet Take 1 tablet (20 mg total) by mouth daily. 90 tablet 3   No current facility-administered medications on file prior to visit.    Review of Systems  Constitutional: Negative for unusual diaphoresis or night sweats HENT: Negative for ear swelling or discharge Eyes: Negative for worsening visual haziness  Respiratory: Negative for choking and stridor.   Gastrointestinal: Negative for distension or worsening eructation Genitourinary: Negative for retention or change in urine volume.  Musculoskeletal: Negative for other MSK pain or swelling Skin: Negative for color change and worsening wound Neurological: Negative for tremors and numbness other than noted  Psychiatric/Behavioral: Negative for decreased concentration or agitation other than above       Objective:   Physical Exam BP 122/80   Pulse (!) 51   Temp 98.4 F (36.9 C) (Oral)   Resp 20   Wt 119 lb (54 kg)   SpO2 97%   BMI 20.43 kg/m   VS noted,  Constitutional: Pt appears in no apparent distress HENT: Head:  NCAT.  Right Ear: External ear normal.  Left Ear: External ear normal.  Eyes: . Pupils are equal, round, and reactive to light. Conjunctivae and EOM are normal Neck: Normal range of motion. Neck supple.  Cardiovascular: Normal rate and regular rhythm.   Pulmonary/Chest: Effort normal and breath sounds without rales or wheezing.  Abd:  Soft, ND, + BS with tender epigastrium Spine with mild bilat lumbar paravertebral tender without rash or swelling Neurological: Pt is  alert. Not confused , motor 5/5 intact, sens intact to LT Skin: Skin is warm. No rash, no LE edema Psychiatric: Pt behavior is normal. No agitation.   Mar 2017 abd u;s summary IMPRESSION: Unremarkable abdominal ultrasound.   Electronically Signed  By: Garald Balding M.D.  On: 12/21/2015 00:27    Assessment & Plan:

## 2016-04-23 NOTE — Patient Instructions (Addendum)
OK to please try wearing bilateral wrist splints at night until the numbness in the morning is improved  Please take all new medication as prescribed - the antibiotic (sent to [facility])  Please continue all other medications as before, including the protonix  Please have the pharmacy call with any other refills you may need.  Please keep your appointments with your specialists as you may have planned  You will be contacted regarding the referral for: Gastroenterology  Please go to the LAB in the Basement (turn left off the elevator) for the tests to be done today  You will be contacted by phone if any changes need to be made immediately.  Otherwise, you will receive a letter about your results with an explanation, but please check with MyChart first.  Please remember to sign up for MyChart if you have not done so, as this will be important to you in the future with finding out test results, communicating by private email, and scheduling acute appointments online when needed.

## 2016-04-23 NOTE — Progress Notes (Signed)
Pre visit review using our clinic review tool, if applicable. No additional management support is needed unless otherwise documented below in the visit note. 

## 2016-04-24 ENCOUNTER — Encounter: Payer: Self-pay | Admitting: Internal Medicine

## 2016-04-24 LAB — CBC WITH DIFFERENTIAL/PLATELET
Basophils Absolute: 0 10*3/uL (ref 0.0–0.1)
Basophils Relative: 0.3 % (ref 0.0–3.0)
EOS PCT: 1.2 % (ref 0.0–5.0)
Eosinophils Absolute: 0.1 10*3/uL (ref 0.0–0.7)
HEMATOCRIT: 46.6 % (ref 39.0–52.0)
HEMOGLOBIN: 15.3 g/dL (ref 13.0–17.0)
LYMPHS ABS: 1.9 10*3/uL (ref 0.7–4.0)
Lymphocytes Relative: 24 % (ref 12.0–46.0)
MCHC: 32.9 g/dL (ref 30.0–36.0)
MCV: 87.5 fl (ref 78.0–100.0)
MONOS PCT: 4.6 % (ref 3.0–12.0)
Monocytes Absolute: 0.4 10*3/uL (ref 0.1–1.0)
Neutro Abs: 5.5 10*3/uL (ref 1.4–7.7)
Neutrophils Relative %: 69.9 % (ref 43.0–77.0)
Platelets: 245 10*3/uL (ref 150.0–400.0)
RBC: 5.33 Mil/uL (ref 4.22–5.81)
RDW: 12.9 % (ref 11.5–15.5)
WBC: 7.9 10*3/uL (ref 4.0–10.5)

## 2016-04-25 LAB — URINE CULTURE: Organism ID, Bacteria: NO GROWTH

## 2016-04-28 NOTE — Assessment & Plan Note (Addendum)
?   Etiology, delcines dre, for empiric antibx for possible prostatitis

## 2016-04-28 NOTE — Assessment & Plan Note (Signed)
Chronic stable,  to f/u any worsening symptoms or concerns 

## 2016-04-28 NOTE — Assessment & Plan Note (Signed)
Declines further tx, to f/u any worsening symptoms or concerns

## 2016-04-28 NOTE — Assessment & Plan Note (Signed)
Etiology unclear, Exam otherwise benign, to check labs as documented, follow with expectant management  

## 2016-04-28 NOTE — Assessment & Plan Note (Signed)
For UA as above,

## 2016-04-28 NOTE — Assessment & Plan Note (Signed)
Mild symptoms, for bilat wrist splints at night

## 2016-04-28 NOTE — Assessment & Plan Note (Addendum)
Etiology unclear, for labs and GI referral  The test results show that your current treatment is OK.   There is no need for change of treatment or further evaluation based on these results at this time.  Any other results that are shown as abnormal are not felt to be significant at this time, and can be reviewed at your next visit.  Please otherwise continue the same plan for further evaluation and treatment as discussed.

## 2016-04-28 NOTE — Assessment & Plan Note (Signed)
stable overall by history and exam, and pt to continue medical treatment as before,  to f/u any worsening symptoms or concerns 

## 2016-04-28 NOTE — Assessment & Plan Note (Signed)
?   IBS , etiology unclear, also for GI referral

## 2016-04-28 NOTE — Assessment & Plan Note (Signed)
Lab Results  Component Value Date   TSH 1.66 04/23/2016  stable overall by history and exam, recent data reviewed with pt, and pt to continue medical treatment as before,  to f/u any worsening symptoms or concerns

## 2016-04-28 NOTE — Assessment & Plan Note (Signed)
Small volume, etiology unclear, for GI referral, may need colonscopy

## 2016-04-28 NOTE — Assessment & Plan Note (Signed)
?   Significance, may need EGD as well

## 2016-06-27 ENCOUNTER — Encounter: Payer: Self-pay | Admitting: Internal Medicine

## 2016-07-08 ENCOUNTER — Encounter: Payer: Self-pay | Admitting: Internal Medicine

## 2016-08-13 ENCOUNTER — Ambulatory Visit: Payer: BLUE CROSS/BLUE SHIELD | Admitting: Internal Medicine

## 2016-11-05 ENCOUNTER — Ambulatory Visit (INDEPENDENT_AMBULATORY_CARE_PROVIDER_SITE_OTHER): Payer: BLUE CROSS/BLUE SHIELD | Admitting: Internal Medicine

## 2016-11-05 ENCOUNTER — Encounter: Payer: Self-pay | Admitting: Internal Medicine

## 2016-11-05 DIAGNOSIS — L02412 Cutaneous abscess of left axilla: Secondary | ICD-10-CM | POA: Diagnosis not present

## 2016-11-05 MED ORDER — SULFAMETHOXAZOLE-TRIMETHOPRIM 800-160 MG PO TABS: 1.0000 | ORAL_TABLET | Freq: Two times a day (BID) | ORAL | 0 refills | Status: DC

## 2016-11-05 MED ORDER — TRAMADOL HCL 50 MG PO TABS: 50.0000 mg | ORAL_TABLET | Freq: Four times a day (QID) | ORAL | 0 refills | Status: DC | PRN

## 2016-11-05 MED ORDER — LIDOCAINE 4 % EX GEL: CUTANEOUS | 0 refills | Status: AC

## 2016-11-05 NOTE — Progress Notes (Signed)
Pre visit review using our clinic review tool, if applicable. No additional management support is needed unless otherwise documented below in the visit note. 

## 2016-11-05 NOTE — Progress Notes (Signed)
   Subjective:    Patient ID: Samuel Meza, male    DOB: 1971/05/13, 46 y.o.   MRN: [id number]  HPI  Here with interpretor, c/o 5 days onset gradually worsening left axilla red/tender/swelling area with feeling poorly, mild warm off and on, no chills and no drainage.  Pt denies chest pain, increased sob or doe, wheezing, orthopnea, PND, increased LE swelling, palpitations, dizziness or syncope. Past Medical History:  Diagnosis Date  . Diabetes mellitus without complication ([place])   . GERD (gastroesophageal reflux disease) 03/01/2014  . Gout   . Hemorrhoids, internal, with bleeding 07/20/2013  . Hyperlipidemia   . Thyroid disease    Past Surgical History:  Procedure Laterality Date  . COLONOSCOPY  2014  . ESOPHAGOGASTRODUODENOSCOPY N/A 06/16/2013   Procedure: ESOPHAGOGASTRODUODENOSCOPY (EGD);  Surgeon: Milus Banister, MD;  Location: [place];  Service: Endoscopy;  Laterality: N/A;  . HEMORRHOID BANDING  2014    reports that he has never smoked. He has never used smokeless tobacco. He reports that he drinks alcohol. He reports that he does not use drugs. family history includes Hyperlipidemia in his father, mother, and other; Hypertension in his father, mother, and other. No Known Allergies Current Outpatient Prescriptions on File Prior to Visit  Medication Sig Dispense Refill  . allopurinol (ZYLOPRIM) 300 MG tablet Take 1 tablet (300 mg total) by mouth daily. 90 tablet 3  . dicyclomine (BENTYL) 10 MG capsule Take 1 capsule (10 mg total) by mouth 4 (four) times daily -  before meals and at bedtime. 90 capsule 1  . levothyroxine (SYNTHROID, LEVOTHROID) 88 MCG tablet Take 1 tablet (88 mcg total) by mouth daily. 90 tablet 3  . Multiple Vitamin (MULTIVITAMIN) tablet Take 1 tablet by mouth daily.    . pantoprazole (PROTONIX) 40 MG tablet Take 1 tablet (40 mg total) by mouth daily. 90 tablet 3  . simvastatin (ZOCOR) 20 MG tablet Take 1 tablet (20 mg total) by mouth daily. 90 tablet 3   No  current facility-administered medications on file prior to visit.    Review of Systems All otherwise neg per pt     Objective:   Physical Exam BP 126/72   Pulse 97   Temp 98.6 F (37 C) (Oral)   Resp 20   Wt 126 lb (57.2 kg)   SpO2 97%   BMI 21.63 kg/m  VS noted,  Constitutional: Pt appears in no apparent distress HENT: Head: NCAT.  Right Ear: External ear normal.  Left Ear: External ear normal.  Eyes: . Pupils are equal, round, and reactive to light. Conjunctivae and EOM are normal Neck: Normal range of motion. Neck supple.  Cardiovascular: Normal rate and regular rhythm.   Pulmonary/Chest: Effort normal and breath sounds without rales or wheezing.  Neurological: Pt is alert. Not confused , motor grossly intact Skin: Skin is warm. No rash, no LE edema, left axilla with 1-2 cm area red/tender/fluctuance without active drainge Psychiatric: Pt behavior is normal. No agitation.  No other new exam findings    Assessment & Plan:

## 2016-11-05 NOTE — Assessment & Plan Note (Signed)
Small, for pain control, antibx, and refer General Surgury

## 2016-11-05 NOTE — Patient Instructions (Addendum)
Please take all new medication as prescribed - the antibiotic, topical gel for pain , and pill for pain as well  You will be contacted regarding the referral for: General Surgury  Please continue all other medications as before, and refills have been done if requested.  Please have the pharmacy call with any other refills you may need.  Please keep your appointments with your specialists as you may have planned

## 2016-11-06 ENCOUNTER — Telehealth: Payer: Self-pay | Admitting: Internal Medicine

## 2016-11-06 NOTE — Telephone Encounter (Signed)
Pt's brother called in and stated that pt is feeling better and does not need a referral to a surgeon at this time for the lumps under his arms, please advise

## 2016-11-06 NOTE — Telephone Encounter (Signed)
Ok, I wil forward to [facility]

## 2016-11-06 NOTE — Telephone Encounter (Signed)
Referral cancelled. 

## 2017-01-29 ENCOUNTER — Other Ambulatory Visit: Payer: Self-pay | Admitting: Internal Medicine

## 2017-02-08 ENCOUNTER — Other Ambulatory Visit: Payer: Self-pay | Admitting: Internal Medicine

## 2017-03-04 ENCOUNTER — Other Ambulatory Visit: Payer: Self-pay | Admitting: Internal Medicine

## 2017-04-25 ENCOUNTER — Encounter: Payer: Self-pay | Admitting: Internal Medicine

## 2017-04-25 ENCOUNTER — Ambulatory Visit (INDEPENDENT_AMBULATORY_CARE_PROVIDER_SITE_OTHER): Payer: BLUE CROSS/BLUE SHIELD | Admitting: Internal Medicine

## 2017-04-25 ENCOUNTER — Other Ambulatory Visit (INDEPENDENT_AMBULATORY_CARE_PROVIDER_SITE_OTHER): Payer: BLUE CROSS/BLUE SHIELD

## 2017-04-25 VITALS — BP 106/70 | HR 70 | Ht 64.0 in | Wt 120.0 lb

## 2017-04-25 DIAGNOSIS — Z0000 Encounter for general adult medical examination without abnormal findings: Secondary | ICD-10-CM | POA: Diagnosis not present

## 2017-04-25 DIAGNOSIS — Z114 Encounter for screening for human immunodeficiency virus [HIV]: Secondary | ICD-10-CM

## 2017-04-25 DIAGNOSIS — Z0001 Encounter for general adult medical examination with abnormal findings: Secondary | ICD-10-CM | POA: Diagnosis not present

## 2017-04-25 DIAGNOSIS — B001 Herpesviral vesicular dermatitis: Secondary | ICD-10-CM

## 2017-04-25 LAB — URINALYSIS, ROUTINE W REFLEX MICROSCOPIC
BILIRUBIN URINE: NEGATIVE
Hgb urine dipstick: NEGATIVE
KETONES UR: NEGATIVE
Leukocytes, UA: NEGATIVE
Nitrite: NEGATIVE
PH: 6.5 (ref 5.0–8.0)
SPECIFIC GRAVITY, URINE: 1.01 (ref 1.000–1.030)
TOTAL PROTEIN, URINE-UPE24: NEGATIVE
URINE GLUCOSE: NEGATIVE
UROBILINOGEN UA: 0.2 (ref 0.0–1.0)

## 2017-04-25 LAB — CBC WITH DIFFERENTIAL/PLATELET
BASOS PCT: 0.4 % (ref 0.0–3.0)
Basophils Absolute: 0 10*3/uL (ref 0.0–0.1)
EOS PCT: 1.5 % (ref 0.0–5.0)
Eosinophils Absolute: 0.1 10*3/uL (ref 0.0–0.7)
HCT: 47.6 % (ref 39.0–52.0)
Hemoglobin: 15.8 g/dL (ref 13.0–17.0)
Lymphocytes Relative: 29.1 % (ref 12.0–46.0)
Lymphs Abs: 2 10*3/uL (ref 0.7–4.0)
MCHC: 33.2 g/dL (ref 30.0–36.0)
MCV: 88.2 fl (ref 78.0–100.0)
MONO ABS: 0.5 10*3/uL (ref 0.1–1.0)
Monocytes Relative: 6.8 % (ref 3.0–12.0)
NEUTROS PCT: 62.2 % (ref 43.0–77.0)
Neutro Abs: 4.2 10*3/uL (ref 1.4–7.7)
Platelets: 254 10*3/uL (ref 150.0–400.0)
RBC: 5.39 Mil/uL (ref 4.22–5.81)
RDW: 13.2 % (ref 11.5–15.5)
WBC: 6.8 10*3/uL (ref 4.0–10.5)

## 2017-04-25 LAB — HEPATIC FUNCTION PANEL
ALBUMIN: 5 g/dL (ref 3.5–5.2)
ALK PHOS: 35 U/L — AB (ref 39–117)
ALT: 23 U/L (ref 0–53)
AST: 22 U/L (ref 0–37)
Bilirubin, Direct: 0.1 mg/dL (ref 0.0–0.3)
Total Bilirubin: 0.6 mg/dL (ref 0.2–1.2)
Total Protein: 7.9 g/dL (ref 6.0–8.3)

## 2017-04-25 LAB — BASIC METABOLIC PANEL
BUN: 17 mg/dL (ref 6–23)
CO2: 33 mEq/L — ABNORMAL HIGH (ref 19–32)
Calcium: 10.4 mg/dL (ref 8.4–10.5)
Chloride: 99 mEq/L (ref 96–112)
Creatinine, Ser: 0.9 mg/dL (ref 0.40–1.50)
GFR: 96.93 mL/min (ref >60.00)
GLUCOSE: 102 mg/dL — AB (ref 70–99)
POTASSIUM: 4.3 meq/L (ref 3.5–5.1)
SODIUM: 139 meq/L (ref 135–145)

## 2017-04-25 LAB — PSA: PSA: 1.05 ng/mL (ref 0.10–4.00)

## 2017-04-25 LAB — LIPID PANEL
CHOLESTEROL: 144 mg/dL (ref 0–200)
HDL: 47.4 mg/dL (ref >39.00)
LDL CALC: 76 mg/dL (ref 0–99)
NonHDL: 96.89
Total CHOL/HDL Ratio: 3
Triglycerides: 106 mg/dL (ref 0.0–149.0)
VLDL: 21.2 mg/dL (ref 0.0–40.0)

## 2017-04-25 LAB — TSH: TSH: 2.31 u[IU]/mL (ref 0.35–4.50)

## 2017-04-25 MED ORDER — PANTOPRAZOLE SODIUM 40 MG PO TBEC: 40.0000 mg | DELAYED_RELEASE_TABLET | Freq: Every day | ORAL | 3 refills | Status: DC

## 2017-04-25 MED ORDER — SIMVASTATIN 20 MG PO TABS: 20.0000 mg | ORAL_TABLET | Freq: Every day | ORAL | 3 refills | Status: DC

## 2017-04-25 MED ORDER — ALLOPURINOL 300 MG PO TABS: 300.0000 mg | ORAL_TABLET | Freq: Every day | ORAL | 3 refills | Status: DC

## 2017-04-25 MED ORDER — LEVOTHYROXINE SODIUM 88 MCG PO TABS: 88.0000 ug | ORAL_TABLET | Freq: Every day | ORAL | 3 refills | Status: DC

## 2017-04-25 MED ORDER — VALACYCLOVIR HCL 500 MG PO TABS: 500.0000 mg | ORAL_TABLET | Freq: Every day | ORAL | 3 refills | Status: DC

## 2017-04-25 NOTE — Patient Instructions (Signed)
Please take all new medication as prescribed - the valtrex 500 mg per day to prevent the cold sores so often  Please continue all other medications as before, and refills have been done if requested.  Please have the pharmacy call with any other refills you may need.  Please continue your efforts at being more active, low cholesterol diet, and weight control.  You are otherwise up to date with prevention measures today.  Please keep your appointments with your specialists as you may have planned  Please go to the LAB in the Basement (turn left off the elevator) for the tests to be done today  You will be contacted by phone if any changes need to be made immediately.  Otherwise, you will receive a letter about your results with an explanation, but please check with MyChart first.  Please remember to sign up for MyChart if you have not done so, as this will be important to you in the future with finding out test results, communicating by private email, and scheduling acute appointments online when needed.  Please return in 1 year for your yearly visit, or sooner if needed, with Lab testing done 3-5 days before

## 2017-04-25 NOTE — Progress Notes (Signed)
Subjective:    Patient ID: Samuel Meza, male    DOB: 1971/03/31, 46 y.o.   MRN: [id number]  HPI Here for wellness and f/u with brother as interpretor as before;  Overall doing ok;  Pt denies Chest pain, worsening SOB, DOE, wheezing, orthopnea, PND, worsening LE edema, palpitations, dizziness or syncope.  Pt denies neurological change such as new headache, facial or extremity weakness.  Pt denies polydipsia, polyuria, or low sugar symptoms. Pt states overall good compliance with treatment and medications, good tolerability, and has been trying to follow appropriate diet.  Pt denies worsening depressive symptoms, suicidal ideation or panic. No fever, night sweats, wt loss, loss of appetite, or other constitutional symptoms.  Pt states good ability with ADL's, has low fall risk, home safety reviewed and adequate, no other significant changes in hearing or vision, and not active with exercise.  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 Diabetes mellitus without complication ([place])   . GERD (gastroesophageal reflux disease) 03/01/2014  . Gout   . Hemorrhoids, internal, with bleeding 07/20/2013  . Hyperlipidemia   . Thyroid disease    Past Surgical History:  Procedure Laterality Date  . COLONOSCOPY  2014  . ESOPHAGOGASTRODUODENOSCOPY N/A 06/16/2013   Procedure: ESOPHAGOGASTRODUODENOSCOPY (EGD);  Surgeon: Milus Banister, MD;  Location: [facility];  Service: Endoscopy;  Laterality: N/A;  . HEMORRHOID BANDING  2014    reports that he has never smoked. He has never used smokeless tobacco. He reports that he drinks alcohol. He reports that he does not use drugs. family history includes Hyperlipidemia in his father, mother, and other; Hypertension in his father, mother, and other. No Known Allergies Current Outpatient Prescriptions on File Prior to Visit  Medication Sig Dispense Refill  . Lidocaine 4 % GEL Use as directed  every 12 hrs as needed topically to affected area 10 g 0  . Multiple Vitamin (MULTIVITAMIN) tablet Take 1 tablet by mouth daily.     No current facility-administered medications on file prior to visit.    Review of Systems Constitutional: Negative for other unusual diaphoresis, sweats, appetite or weight changes HENT: Negative for other worsening hearing loss, ear pain, facial swelling, mouth sores or neck stiffness.   Eyes: Negative for other worsening pain, redness or other visual disturbance.  Respiratory: Negative for other stridor or swelling Cardiovascular: Negative for other palpitations or other chest pain  Gastrointestinal: Negative for worsening diarrhea or loose stools, blood in stool, distention or other pain Genitourinary: Negative for hematuria, flank pain or other change in urine volume.  Musculoskeletal: Negative for myalgias or other joint swelling.  Skin: Negative for other color change, or other wound or worsening drainage.  Neurological: Negative for other syncope or numbness. Hematological: Negative for other adenopathy or swelling Psychiatric/Behavioral: Negative for hallucinations, other worsening agitation, SI, self-injury, or new decreased concentration All other system neg per pt    Objective:   Physical Exam BP 106/70   Pulse 70   Ht 5\' 4"  (1.626 m)   Wt 120 lb (54.4 kg)   SpO2 98%   BMI 20.60 kg/m  VS noted,  Constitutional: Pt is oriented to person, place, and time. Appears well-developed and well-nourished, in no significant distress and comfortable Head: Normocephalic and atraumatic  Eyes: Conjunctivae and EOM are normal. Pupils are equal, round, and reactive to light Right Ear: External ear normal without discharge Left Ear: External ear normal  without discharge Nose: Nose without discharge or deformity Mouth/Throat: Oropharynx is without other ulcerations and moist  Neck: Normal range of motion. Neck supple. No JVD present. No tracheal deviation  present or significant neck LA or mass Cardiovascular: Normal rate, regular rhythm, normal heart sounds and intact distal pulses.   Pulmonary/Chest: WOB normal and breath sounds without rales or wheezing  Abdominal: Soft. Bowel sounds are normal. NT. No HSM  Musculoskeletal: Normal range of motion. Exhibits no edema Lymphadenopathy: Has no other cervical adenopathy.  Neurological: Pt is alert and oriented to person, place, and time. Pt has normal reflexes. No cranial nerve deficit. Motor grossly intact, Gait intact Skin: Skin is warm and dry. No rash noted or new ulcerations Psychiatric:  Has normal mood and affect. Behavior is normal without agitation No other exam findings Lab Results  Component Value Date   WBC 6.8 04/25/2017   HGB 15.8 04/25/2017   HCT 47.6 04/25/2017   PLT 254.0 04/25/2017   GLUCOSE 102 (H) 04/25/2017   CHOL 144 04/25/2017   TRIG 106.0 04/25/2017   HDL 47.40 04/25/2017   LDLCALC 76 04/25/2017   ALT 23 04/25/2017   AST 22 04/25/2017   NA 139 04/25/2017   K 4.3 04/25/2017   CL 99 04/25/2017   CREATININE 0.90 04/25/2017   BUN 17 04/25/2017   CO2 33 (H) 04/25/2017   TSH 2.31 04/25/2017   PSA 1.05 04/25/2017   INR 1.1 (H) 08/30/2013   HGBA1C 5.7 01/17/2015   \    Assessment & Plan:

## 2017-04-26 LAB — HIV ANTIBODY (ROUTINE TESTING W REFLEX): HIV: NONREACTIVE

## 2017-04-27 DIAGNOSIS — B001 Herpesviral vesicular dermatitis: Secondary | ICD-10-CM | POA: Insufficient documentation

## 2017-04-27 NOTE — Assessment & Plan Note (Signed)
For valtrex daily asd,  to f/u any worsening symptoms or concerns

## 2017-04-27 NOTE — Assessment & Plan Note (Signed)

## 2017-09-14 IMAGING — US US ABDOMEN COMPLETE
1 series · 14 of 25 positions shown · non-contrast
Comparison: Abdominal ultrasound performed 06/30/2014

CLINICAL DATA: Chronic worsening generalized abdominal distention
and abdominal pain. Initial encounter.

EXAM:
ABDOMEN ULTRASOUND COMPLETE

[Series 1: us abdomen complete · 0.19mm/px · 14 of 77 slices shown]
[im 1/77]
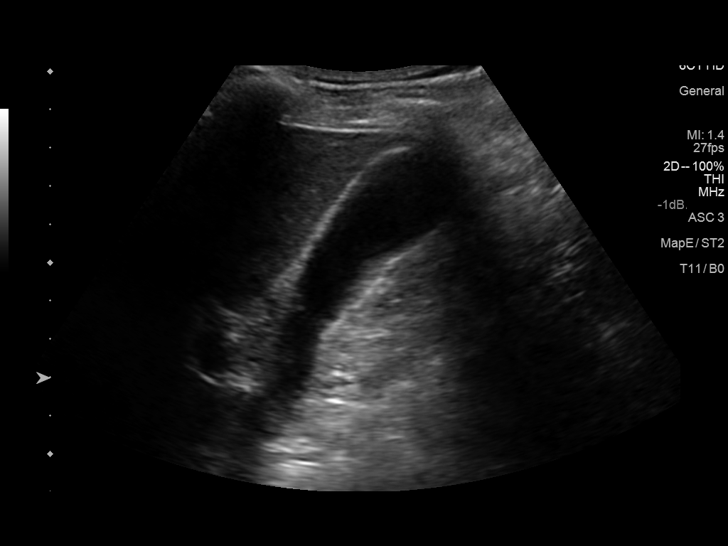
[im 7/77]
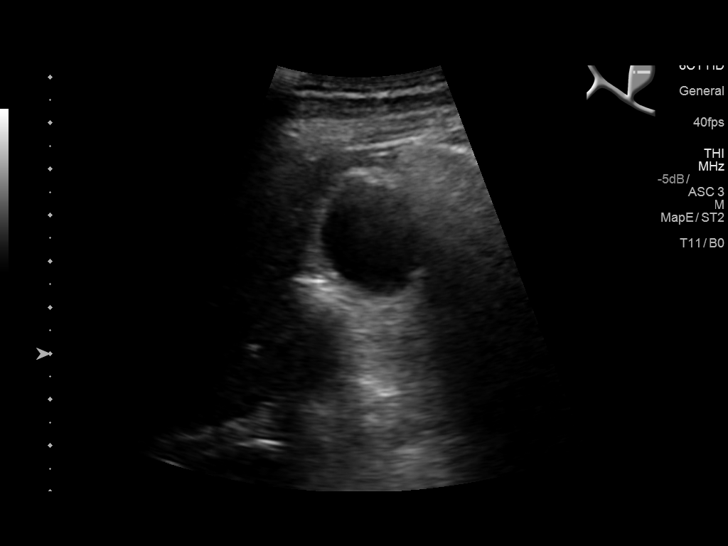
[im 13/77]
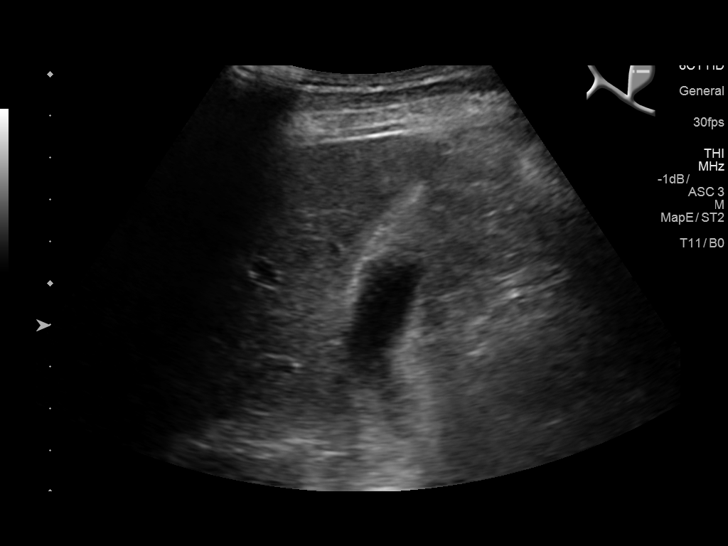
[im 20/77]
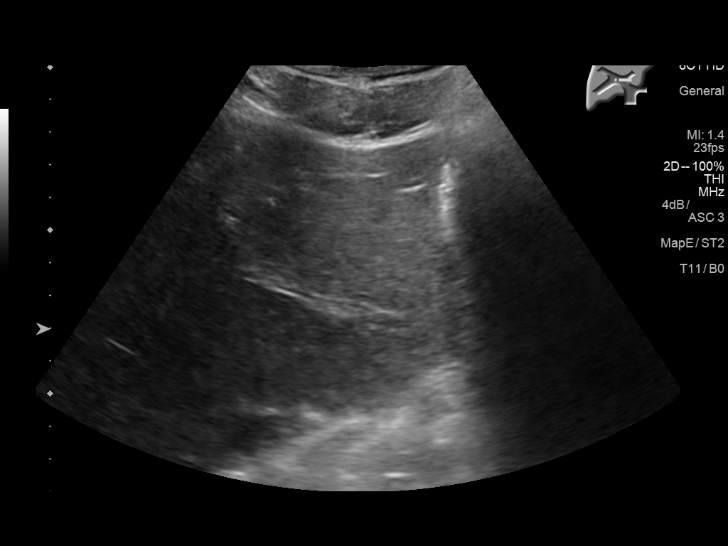
[im 26/77]
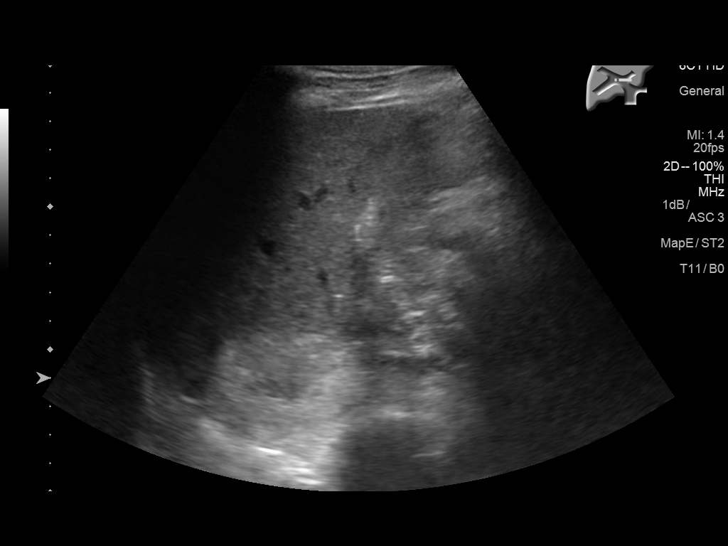
[im 29/77]
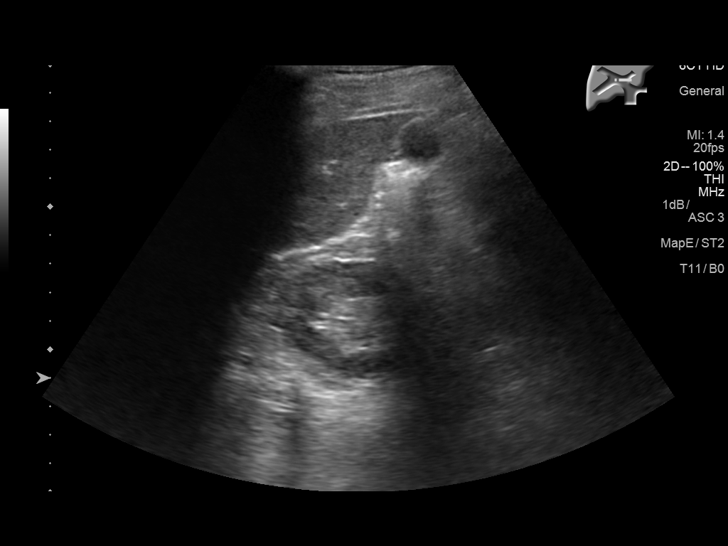
[im 35/77]
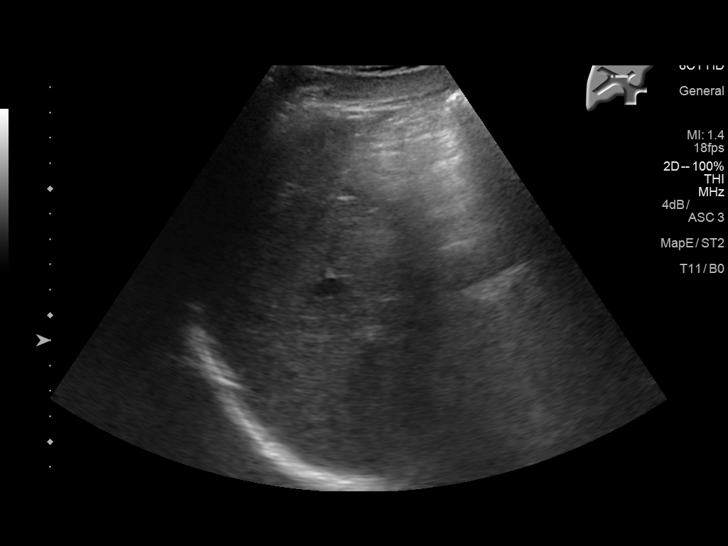
[im 42/77]
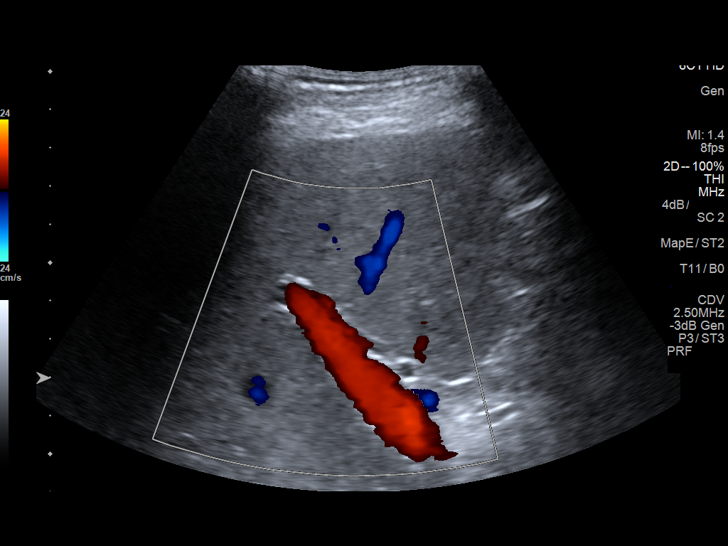
[im 48/77]
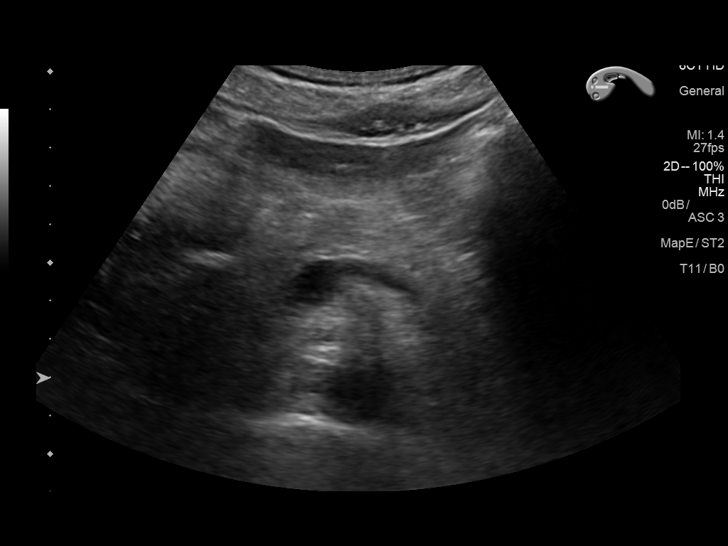
[im 51/77]
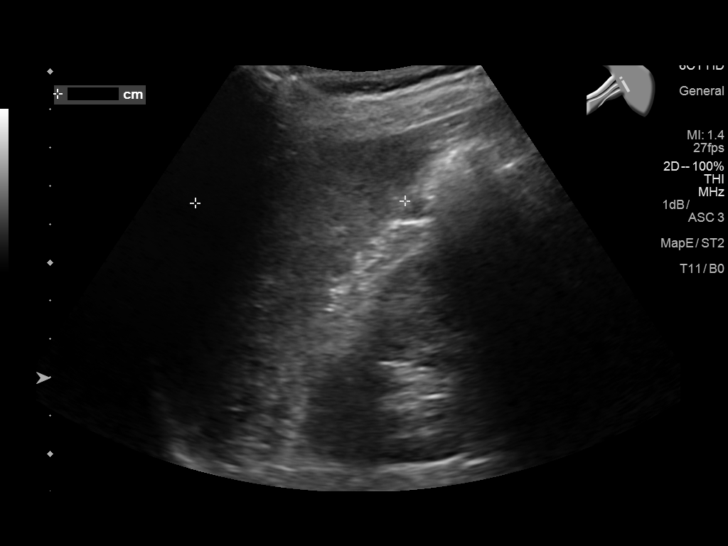
[im 58/77]
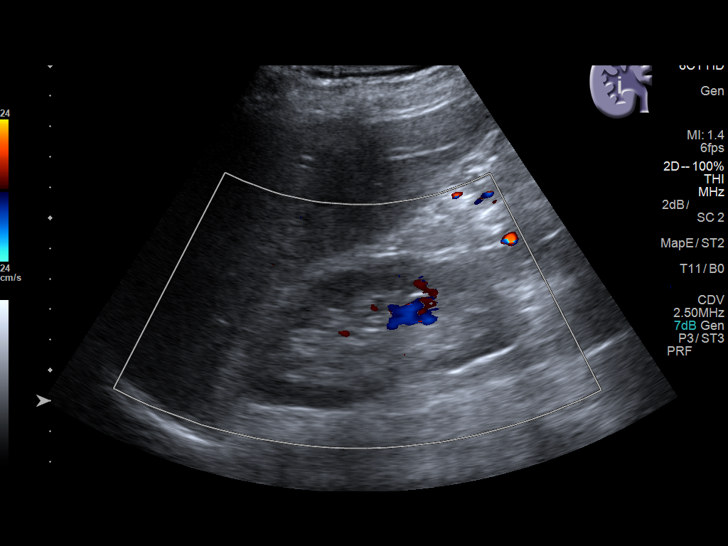
[im 64/77]
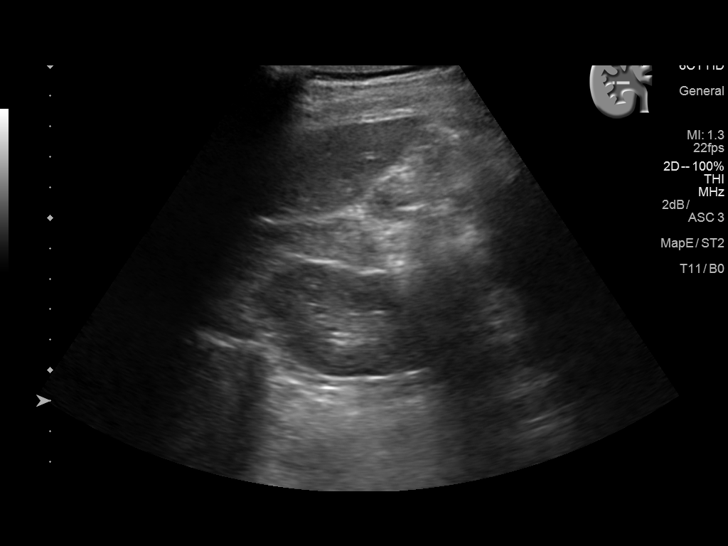
[im 70/77]
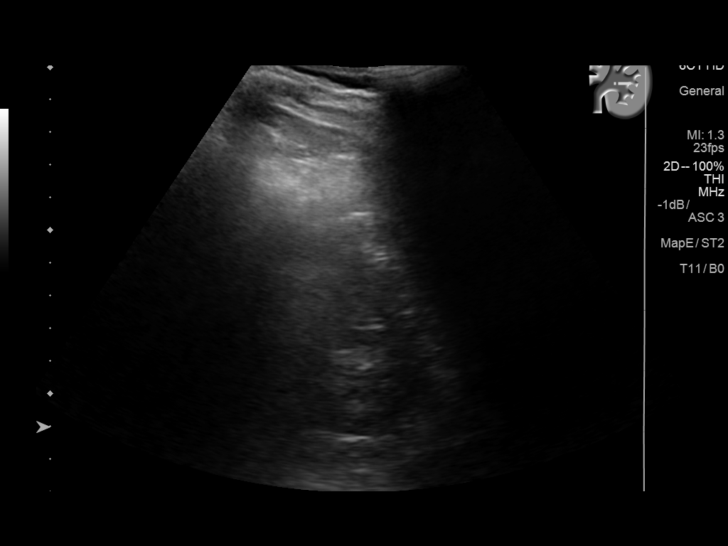
[im 77/77]
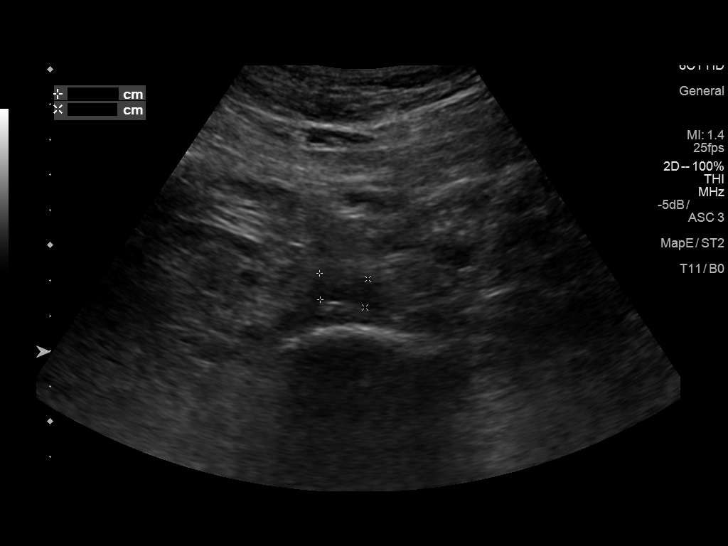

[14 of 25 positions shown; findings below may reference images not displayed]

FINDINGS: Gallbladder: No gallstones or wall thickening visualized. No
sonographic Murphy sign noted by sonographer.

Common bile duct: Diameter: 0.3 cm, within normal limits in caliber.

Liver: No focal lesion identified. Within normal limits in
parenchymal echogenicity.

IVC: No abnormality visualized.

Pancreas: Visualized portion unremarkable.

Spleen: Size and appearance within normal limits.

Right Kidney: Length: 9.4 cm. Echogenicity within normal limits. No
mass or hydronephrosis visualized.

Left Kidney: Length: 9.3 cm. Echogenicity within normal limits. No
mass or hydronephrosis visualized.

Abdominal aorta: No aneurysm visualized.

Other findings: None.
IMPRESSION: Unremarkable abdominal ultrasound.

## 2017-10-11 IMAGING — DX DG HAND COMPLETE 3+V*L*
3 series · 3 of 3 positions shown · non-contrast
Comparison: None.

CLINICAL DATA: Bilateral multi joint PIP pain, swelling. No known
injury.

EXAM:
LEFT HAND - COMPLETE 3+ VIEW

[hand ap]
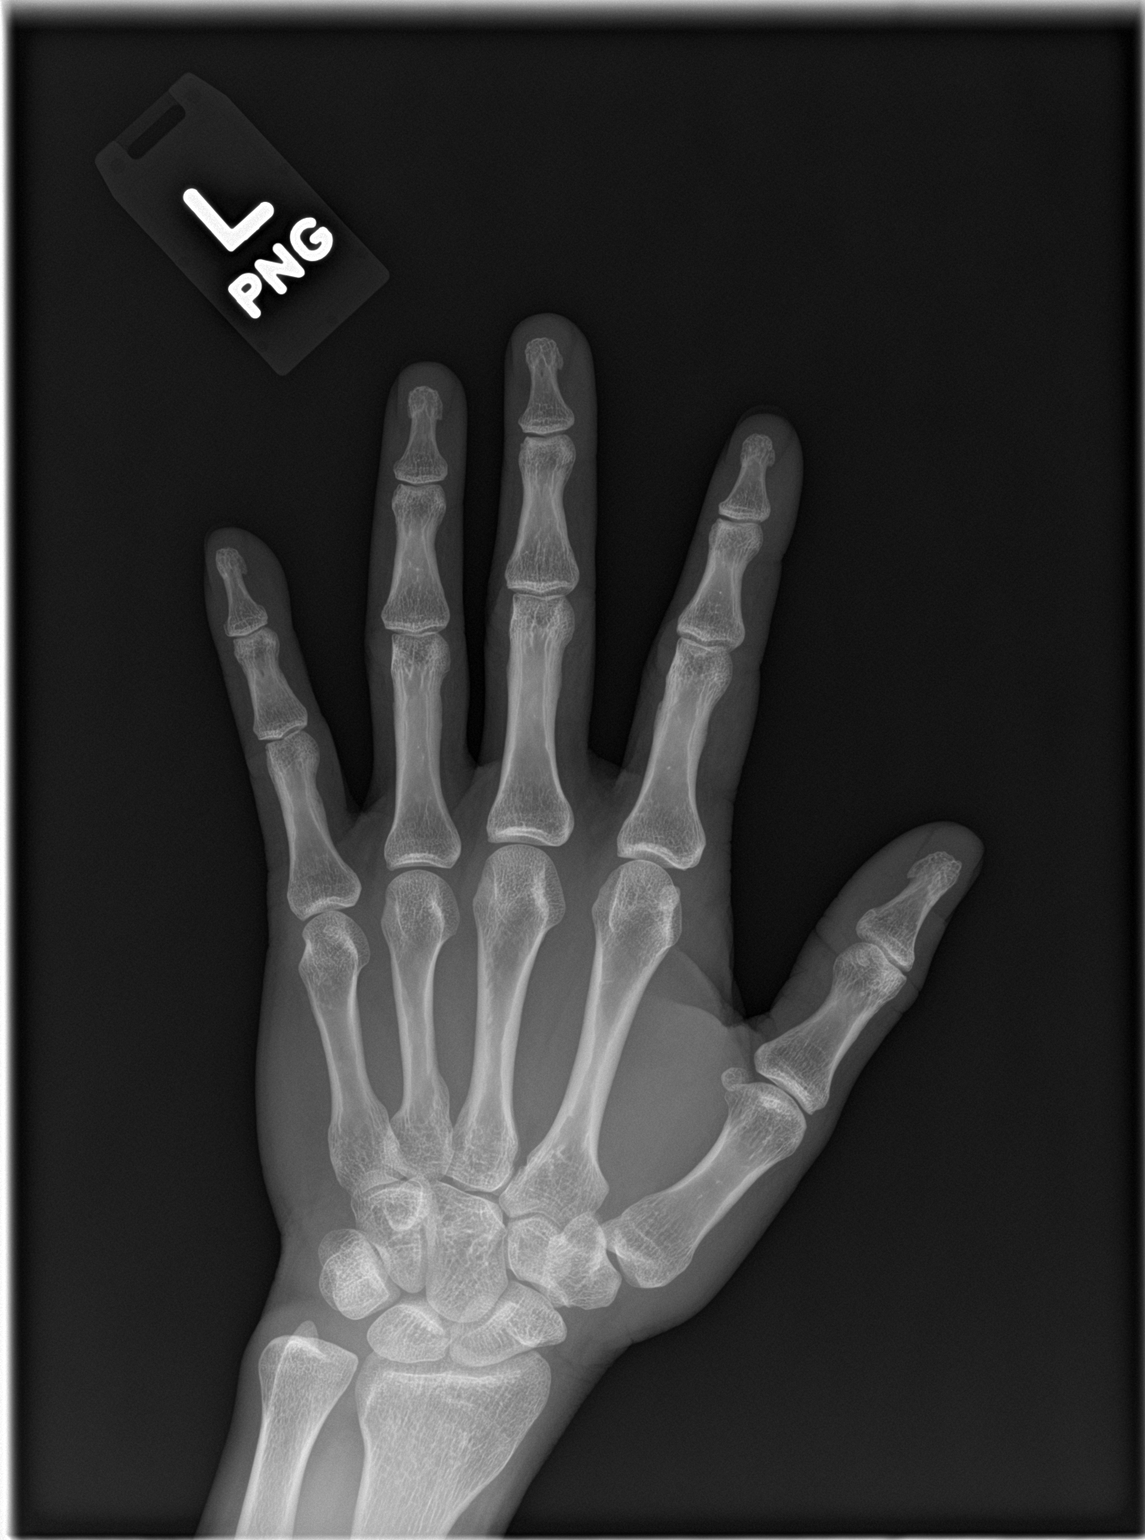

[hand obl]
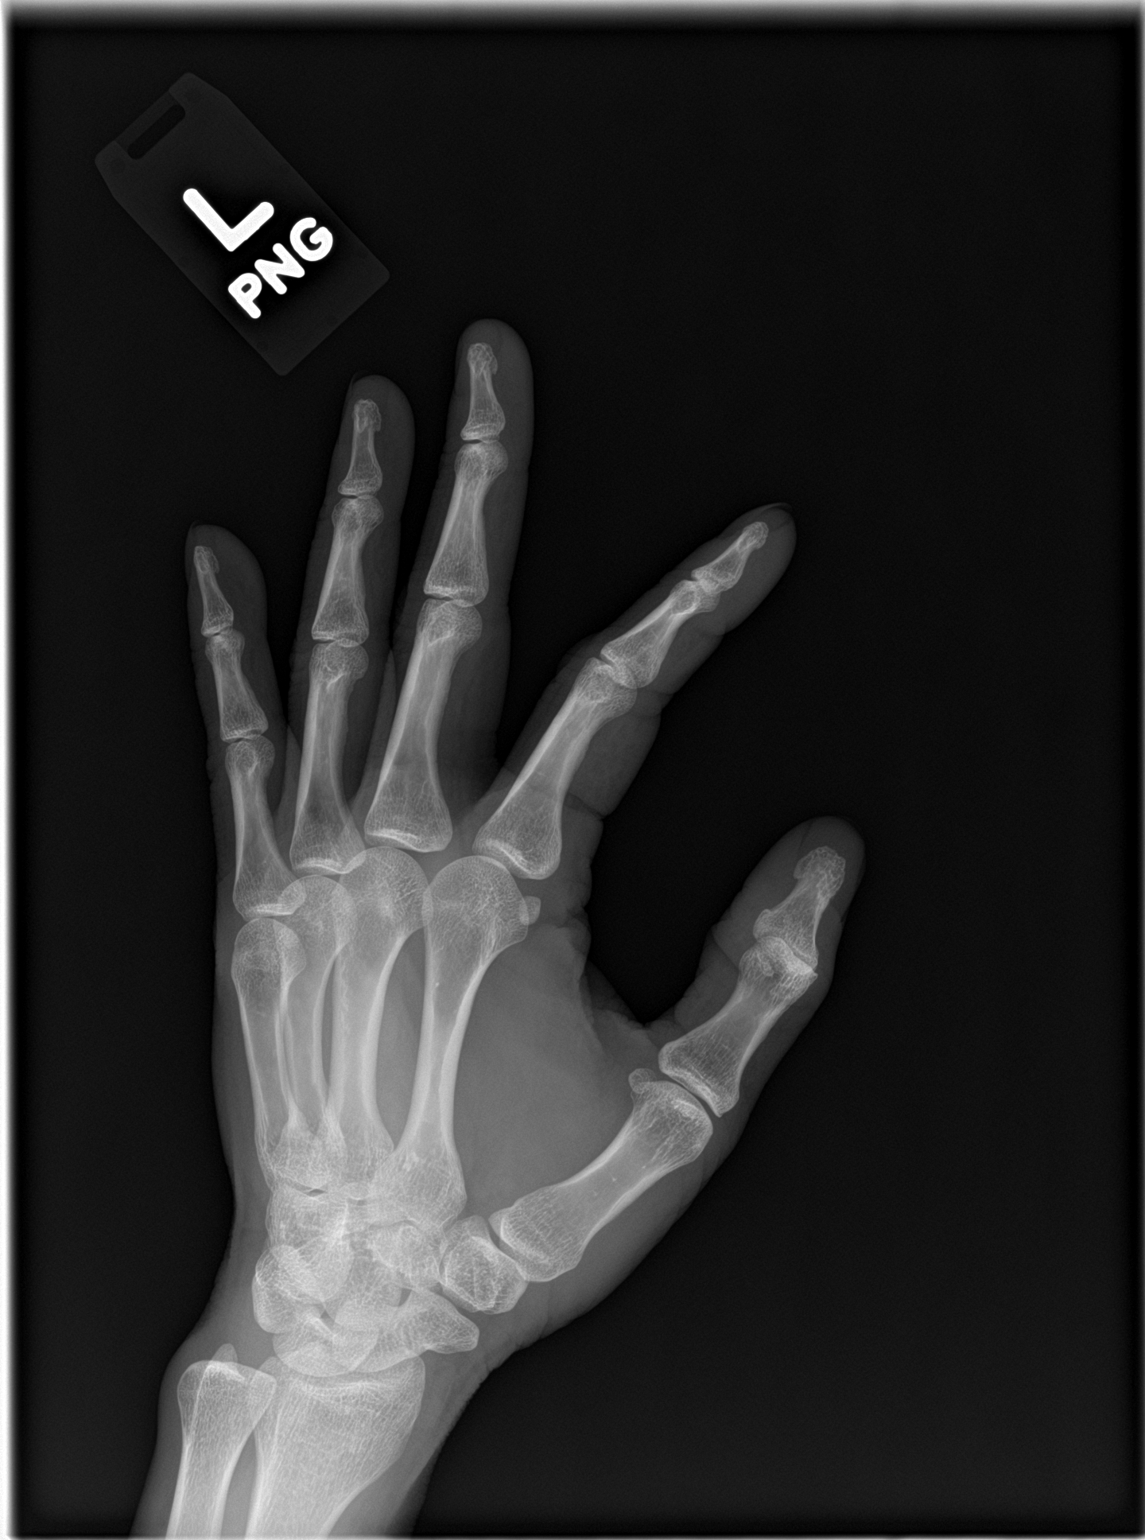

[hand lat]
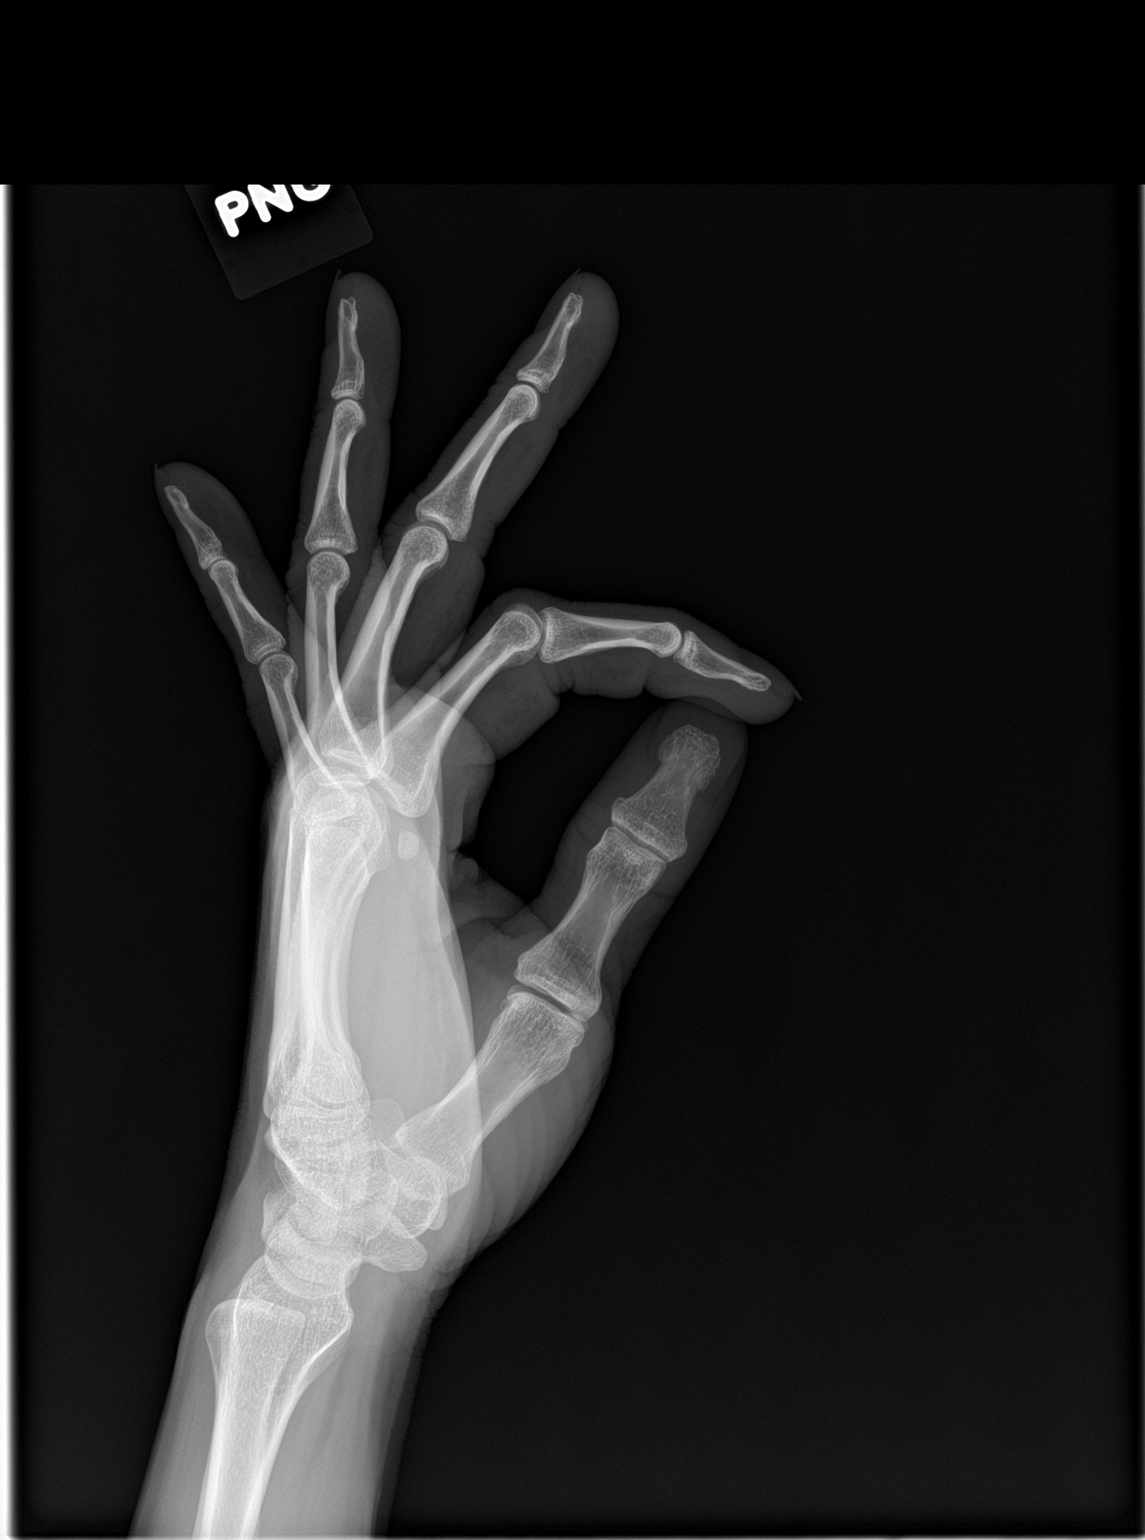

[3 of 3 positions shown; findings below may reference images not displayed]

FINDINGS: No acute bony abnormality. Specifically, no fracture, subluxation,
or dislocation. Soft tissues are intact. Joint spaces are
maintained. Normal bone mineralization.
IMPRESSION: Negative.

## 2017-11-03 ENCOUNTER — Other Ambulatory Visit: Payer: Self-pay | Admitting: Internal Medicine

## 2018-04-30 ENCOUNTER — Other Ambulatory Visit (INDEPENDENT_AMBULATORY_CARE_PROVIDER_SITE_OTHER): Payer: BLUE CROSS/BLUE SHIELD

## 2018-04-30 DIAGNOSIS — Z0001 Encounter for general adult medical examination with abnormal findings: Secondary | ICD-10-CM

## 2018-04-30 DIAGNOSIS — R739 Hyperglycemia, unspecified: Secondary | ICD-10-CM

## 2018-04-30 LAB — CBC WITH DIFFERENTIAL/PLATELET
Basophils Absolute: 0.1 10*3/uL (ref 0.0–0.1)
Basophils Relative: 0.9 % (ref 0.0–3.0)
EOS PCT: 1.2 % (ref 0.0–5.0)
Eosinophils Absolute: 0.1 10*3/uL (ref 0.0–0.7)
HCT: 46.5 % (ref 39.0–52.0)
HEMOGLOBIN: 15.3 g/dL (ref 13.0–17.0)
LYMPHS ABS: 1.5 10*3/uL (ref 0.7–4.0)
LYMPHS PCT: 22.1 % (ref 12.0–46.0)
MCHC: 32.9 g/dL (ref 30.0–36.0)
MCV: 90.2 fl (ref 78.0–100.0)
MONOS PCT: 5.8 % (ref 3.0–12.0)
Monocytes Absolute: 0.4 10*3/uL (ref 0.1–1.0)
NEUTROS PCT: 70 % (ref 43.0–77.0)
Neutro Abs: 4.6 10*3/uL (ref 1.4–7.7)
Platelets: 258 10*3/uL (ref 150.0–400.0)
RBC: 5.15 Mil/uL (ref 4.22–5.81)
RDW: 13.2 % (ref 11.5–15.5)
WBC: 6.6 10*3/uL (ref 4.0–10.5)

## 2018-04-30 LAB — URINALYSIS, ROUTINE W REFLEX MICROSCOPIC
BILIRUBIN URINE: NEGATIVE
HGB URINE DIPSTICK: NEGATIVE
Ketones, ur: NEGATIVE
LEUKOCYTES UA: NEGATIVE
NITRITE: NEGATIVE
RBC / HPF: NONE SEEN (ref 0–?)
Specific Gravity, Urine: <=1.005 — AB (ref 1.000–1.030)
Total Protein, Urine: NEGATIVE
URINE GLUCOSE: NEGATIVE
Urobilinogen, UA: 0.2 (ref 0.0–1.0)
WBC, UA: NONE SEEN (ref 0–?)
pH: 6.5 (ref 5.0–8.0)

## 2018-04-30 LAB — TSH: TSH: 5.35 u[IU]/mL — AB (ref 0.35–4.50)

## 2018-04-30 LAB — BASIC METABOLIC PANEL
BUN: 18 mg/dL (ref 6–23)
CALCIUM: 9.7 mg/dL (ref 8.4–10.5)
CO2: 31 meq/L (ref 19–32)
CREATININE: 0.89 mg/dL (ref 0.40–1.50)
Chloride: 99 mEq/L (ref 96–112)
GFR: 97.75 mL/min (ref >60.00)
GLUCOSE: 116 mg/dL — AB (ref 70–99)
Potassium: 4.3 mEq/L (ref 3.5–5.1)
Sodium: 138 mEq/L (ref 135–145)

## 2018-04-30 LAB — HEPATIC FUNCTION PANEL
ALK PHOS: 27 U/L — AB (ref 39–117)
ALT: 19 U/L (ref 0–53)
AST: 23 U/L (ref 0–37)
Albumin: 4.8 g/dL (ref 3.5–5.2)
Bilirubin, Direct: 0.1 mg/dL (ref 0.0–0.3)
Total Bilirubin: 0.8 mg/dL (ref 0.2–1.2)
Total Protein: 7.9 g/dL (ref 6.0–8.3)

## 2018-04-30 LAB — LIPID PANEL
CHOL/HDL RATIO: 3
Cholesterol: 148 mg/dL (ref 0–200)
HDL: 46.2 mg/dL (ref >39.00)
LDL Cholesterol: 67 mg/dL (ref 0–99)
NONHDL: 101.82
Triglycerides: 174 mg/dL — ABNORMAL HIGH (ref 0.0–149.0)
VLDL: 34.8 mg/dL (ref 0.0–40.0)

## 2018-04-30 LAB — PSA: PSA: 1.22 ng/mL (ref 0.10–4.00)

## 2018-04-30 LAB — HEMOGLOBIN A1C: HEMOGLOBIN A1C: 5.8 % (ref 4.6–6.5)

## 2018-05-05 ENCOUNTER — Encounter: Payer: BLUE CROSS/BLUE SHIELD | Admitting: Internal Medicine

## 2018-05-05 ENCOUNTER — Ambulatory Visit (INDEPENDENT_AMBULATORY_CARE_PROVIDER_SITE_OTHER): Payer: BLUE CROSS/BLUE SHIELD | Admitting: Internal Medicine

## 2018-05-05 ENCOUNTER — Encounter: Payer: Self-pay | Admitting: Internal Medicine

## 2018-05-05 VITALS — BP 106/74 | HR 61 | Temp 97.9°F | Ht 64.0 in | Wt 123.0 lb

## 2018-05-05 DIAGNOSIS — R739 Hyperglycemia, unspecified: Secondary | ICD-10-CM | POA: Diagnosis not present

## 2018-05-05 DIAGNOSIS — Z0000 Encounter for general adult medical examination without abnormal findings: Secondary | ICD-10-CM | POA: Diagnosis not present

## 2018-05-05 MED ORDER — LEVOTHYROXINE SODIUM 88 MCG PO TABS: 88.0000 ug | ORAL_TABLET | Freq: Every day | ORAL | 3 refills | Status: DC

## 2018-05-05 MED ORDER — VALACYCLOVIR HCL 500 MG PO TABS
500.0000 mg | ORAL_TABLET | Freq: Every day | ORAL | 3 refills | Status: AC
Start: 2018-05-05 — End: ?

## 2018-05-05 MED ORDER — SIMVASTATIN 20 MG PO TABS: 20.0000 mg | ORAL_TABLET | Freq: Every day | ORAL | 3 refills | Status: AC

## 2018-05-05 MED ORDER — LEVOTHYROXINE SODIUM 88 MCG PO TABS: 88.0000 ug | ORAL_TABLET | Freq: Every day | ORAL | 3 refills | Status: AC

## 2018-05-05 MED ORDER — ALLOPURINOL 300 MG PO TABS: 300.0000 mg | ORAL_TABLET | Freq: Every day | ORAL | 3 refills | Status: AC

## 2018-05-05 MED ORDER — PANTOPRAZOLE SODIUM 40 MG PO TBEC: 40.0000 mg | DELAYED_RELEASE_TABLET | Freq: Every day | ORAL | 3 refills | Status: AC

## 2018-05-05 MED ORDER — SIMVASTATIN 20 MG PO TABS: 20.0000 mg | ORAL_TABLET | Freq: Every day | ORAL | 3 refills | Status: DC

## 2018-05-05 MED ORDER — ALLOPURINOL 300 MG PO TABS: 300.0000 mg | ORAL_TABLET | Freq: Every day | ORAL | 3 refills | Status: DC

## 2018-05-05 MED ORDER — PANTOPRAZOLE SODIUM 40 MG PO TBEC: 40.0000 mg | DELAYED_RELEASE_TABLET | Freq: Every day | ORAL | 3 refills | Status: DC

## 2018-05-05 MED ORDER — DICLOFENAC SODIUM 1 % TD GEL: TRANSDERMAL | 11 refills | Status: AC

## 2018-05-05 NOTE — Assessment & Plan Note (Signed)
Lab Results  Component Value Date   HGBA1C 5.8 04/30/2018  stable overall by history and exam, recent data reviewed with pt, and pt to continue medical treatment as before,  to f/u any worsening symptoms or concerns

## 2018-05-05 NOTE — Progress Notes (Signed)
Subjective:    Patient ID: Samuel Meza, male    DOB: 1970/11/12, 47 y.o.   MRN: [id number]  HPI  Here for wellness and f/u;  Overall doing ok;  Pt denies Chest pain, worsening SOB, DOE, wheezing, orthopnea, PND, worsening LE edema, palpitations, dizziness or syncope.  Pt denies neurological change such as new headache, facial or extremity weakness.  Pt denies polydipsia, polyuria, or low sugar symptoms. Pt states overall good compliance with treatment and medications, good tolerability, and has been trying to follow appropriate diet.  Pt denies worsening depressive symptoms, suicidal ideation or panic. No fever, night sweats, wt loss, loss of appetite, or other constitutional symptoms.  Pt states good ability with ADL's, has low fall risk, home safety reviewed and adequate, no other significant changes in hearing or vision, and only occasionally active with exercise.  Due for refills No new complaints except for mild intermittent consipation Past Medical History:  Diagnosis Date  . Diabetes mellitus without complication ([place])   . GERD (gastroesophageal reflux disease) 03/01/2014  . Gout   . Hemorrhoids, internal, with bleeding 07/20/2013  . Hyperlipidemia   . Thyroid disease    Past Surgical History:  Procedure Laterality Date  . COLONOSCOPY  2014  . ESOPHAGOGASTRODUODENOSCOPY N/A 06/16/2013   Procedure: ESOPHAGOGASTRODUODENOSCOPY (EGD);  Surgeon: Milus Banister, MD;  Location: [facility];  Service: Endoscopy;  Laterality: N/A;  . HEMORRHOID BANDING  2014    reports that he has never smoked. He has never used smokeless tobacco. He reports that he drinks alcohol. He reports that he does not use drugs. family history includes Hyperlipidemia in his father, mother, and other; Hypertension in his father, mother, and other. No Known Allergies Current Outpatient Medications on File Prior to Visit  Medication Sig Dispense Refill  . Lidocaine 4 % GEL Use as directed every 12 hrs as needed topically  to affected area 10 g 0  . Multiple Vitamin (MULTIVITAMIN) tablet Take 1 tablet by mouth daily.     No current facility-administered medications on file prior to visit.    Review of Systems Constitutional: Negative for other unusual diaphoresis, sweats, appetite or weight changes HENT: Negative for other worsening hearing loss, ear pain, facial swelling, mouth sores or neck stiffness.   Eyes: Negative for other worsening pain, redness or other visual disturbance.  Respiratory: Negative for other stridor or swelling Cardiovascular: Negative for other palpitations or other chest pain  Gastrointestinal: Negative for worsening diarrhea or loose stools, blood in stool, distention or other pain Genitourinary: Negative for hematuria, flank pain or other change in urine volume.  Musculoskeletal: Negative for myalgias or other joint swelling.  Skin: Negative for other color change, or other wound or worsening drainage.  Neurological: Negative for other syncope or numbness. Hematological: Negative for other adenopathy or swelling Psychiatric/Behavioral: Negative for hallucinations, other worsening agitation, SI, self-injury, or new decreased concentration All other system neg per pt    Objective:   Physical Exam BP 106/74   Pulse 61   Temp 97.9 F (36.6 C) (Oral)   Ht 5\' 4"  (1.626 m)   Wt 123 lb (55.8 kg)   SpO2 96%   BMI 21.11 kg/m  VS noted,  Constitutional: Pt is oriented to person, place, and time. Appears well-developed and well-nourished, in no significant distress and comfortable Head: Normocephalic and atraumatic  Eyes: Conjunctivae and EOM are normal. Pupils are equal, round, and reactive to light Right Ear: External ear normal without discharge Left Ear: External ear normal  without discharge Nose: Nose without discharge or deformity Mouth/Throat: Oropharynx is without other ulcerations and moist  Neck: Normal range of motion. Neck supple. No JVD present. No tracheal deviation  present or significant neck LA or mass Cardiovascular: Normal rate, regular rhythm, normal heart sounds and intact distal pulses.   Pulmonary/Chest: WOB normal and breath sounds without rales or wheezing  Abdominal: Soft. Bowel sounds are normal. NT. No HSM  Musculoskeletal: Normal range of motion. Exhibits no edema Lymphadenopathy: Has no other cervical adenopathy.  Neurological: Pt is alert and oriented to person, place, and time. Pt has normal reflexes. No cranial nerve deficit. Motor grossly intact, Gait intact Skin: Skin is warm and dry. No rash noted or new ulcerations Psychiatric:  Has normal mood and affect. Behavior is normal without agitation No other exam findings Lab Results  Component Value Date   WBC 6.6 04/30/2018   HGB 15.3 04/30/2018   HCT 46.5 04/30/2018   PLT 258.0 04/30/2018   GLUCOSE 116 (H) 04/30/2018   CHOL 148 04/30/2018   TRIG 174.0 (H) 04/30/2018   HDL 46.20 04/30/2018   LDLCALC 67 04/30/2018   ALT 19 04/30/2018   AST 23 04/30/2018   NA 138 04/30/2018   K 4.3 04/30/2018   CL 99 04/30/2018   CREATININE 0.89 04/30/2018   BUN 18 04/30/2018   CO2 31 04/30/2018   TSH 5.35 (H) 04/30/2018   PSA 1.22 04/30/2018   INR 1.1 (H) 08/30/2013   HGBA1C 5.8 04/30/2018       Assessment & Plan:

## 2018-05-05 NOTE — Assessment & Plan Note (Signed)

## 2018-05-05 NOTE — Patient Instructions (Signed)
Please continue all other medications as before, and refills have been done if requested.  Please have the pharmacy call with any other refills you may need.  Please continue your efforts at being more active, low cholesterol diet, and weight control.  You are otherwise up to date with prevention measures today.  Please keep your appointments with your specialists as you may have planned  Please return in 1 year for your yearly visit, or sooner if needed, with Lab testing done 3-5 days before  

## 2019-04-12 ENCOUNTER — Telehealth: Payer: Self-pay

## 2019-04-12 NOTE — Telephone Encounter (Signed)
Please disregard previous note.  No ROI fax.

## 2019-04-12 NOTE — Telephone Encounter (Signed)
ROI fax to Dr. Bryson Corona for records

## 2019-05-11 ENCOUNTER — Encounter: Payer: BLUE CROSS/BLUE SHIELD | Admitting: Internal Medicine

## 2019-05-11 DIAGNOSIS — Z0289 Encounter for other administrative examinations: Secondary | ICD-10-CM

## 2019-12-13 ENCOUNTER — Ambulatory Visit: Payer: BLUE CROSS/BLUE SHIELD | Attending: Internal Medicine

## 2019-12-13 DIAGNOSIS — Z23 Encounter for immunization: Secondary | ICD-10-CM | POA: Insufficient documentation

## 2019-12-13 NOTE — Progress Notes (Signed)
   Covid-19 Vaccination Clinic  Name:  Samuel Meza    MRN: [id number] DOB: 1971-06-22  12/13/2019  Samuel Meza was observed post Covid-19 immunization for 15 minutes without incident. He was provided with Vaccine Information Sheet and instruction to access the V-Safe system.   Samuel Meza was instructed to call 911 with any severe reactions post vaccine: Marland Kitchen Difficulty breathing  . Swelling of face and throat  . A fast heartbeat  . A bad rash all over body  . Dizziness and weakness   Immunizations Administered    Name Date Dose VIS Date Route   Pfizer COVID-19 Vaccine 12/13/2019  8:38 AM 0.3 mL 09/17/2019 Intramuscular   Manufacturer: [place]   Lot: [id number]   [place]: [id number]

## 2020-01-12 ENCOUNTER — Ambulatory Visit: Payer: BLUE CROSS/BLUE SHIELD | Attending: Internal Medicine

## 2020-01-12 DIAGNOSIS — Z23 Encounter for immunization: Secondary | ICD-10-CM

## 2020-01-12 NOTE — Progress Notes (Signed)
   Covid-19 Vaccination Clinic  Name:  Samuel Meza    MRN: [id number] DOB: [birth year]  01/12/2020  Mr. Samuel Meza was observed post Covid-19 immunization for 15 minutes without incident. He was provided with Vaccine Information Sheet and instruction to access the V-Safe system.   Mr. Samuel Meza was instructed to call 911 with any severe reactions post vaccine: Marland Kitchen Difficulty breathing  . Swelling of face and throat  . A fast heartbeat  . A bad rash all over body  . Dizziness and weakness   Immunizations Administered    Name Date Dose VIS Date Route   Pfizer COVID-19 Vaccine 01/12/2020  8:56 AM 0.3 mL 09/17/2019 Intramuscular   Manufacturer: [place]   Lot: [id number]   [place]: [id number]
